# Patient Record
Sex: Male | Born: 1965
Health system: Southern US, Community
[De-identification: ages and names within clinical notes are randomized; demographics above are authoritative.]

## PROBLEM LIST (undated history)

## (undated) DIAGNOSIS — K200 Eosinophilic esophagitis: Secondary | ICD-10-CM

## (undated) HISTORY — PX: UPPER GASTROINTESTINAL ENDOSCOPY: SHX188

## (undated) HISTORY — DX: Eosinophilic esophagitis: K20.0

---

## 2019-01-01 ENCOUNTER — Ambulatory Visit (HOSPITAL_COMMUNITY)
Admission: EM | Admit: 2019-01-01 | Discharge: 2019-01-01 | Disposition: A | Discharge status: Home / Self Care | Payer: 59 | Attending: Internal Medicine | Admitting: Internal Medicine

## 2019-01-01 ENCOUNTER — Encounter (HOSPITAL_COMMUNITY): Payer: Self-pay | Admitting: Emergency Medicine

## 2019-01-01 ENCOUNTER — Encounter: Payer: Self-pay | Admitting: *Deleted

## 2019-01-01 ENCOUNTER — Other Ambulatory Visit: Payer: Self-pay

## 2019-01-01 ENCOUNTER — Encounter (HOSPITAL_COMMUNITY)
Admission: EM | Disposition: A | Discharge status: Home / Self Care | Payer: Self-pay | Source: Home / Self Care | Attending: Emergency Medicine

## 2019-01-01 DIAGNOSIS — X58XXXA Exposure to other specified factors, initial encounter: Secondary | ICD-10-CM | POA: Diagnosis not present

## 2019-01-01 DIAGNOSIS — K222 Esophageal obstruction: Secondary | ICD-10-CM | POA: Insufficient documentation

## 2019-01-01 DIAGNOSIS — K229 Disease of esophagus, unspecified: Secondary | ICD-10-CM | POA: Diagnosis not present

## 2019-01-01 DIAGNOSIS — R0789 Other chest pain: Secondary | ICD-10-CM | POA: Insufficient documentation

## 2019-01-01 DIAGNOSIS — K2990 Gastroduodenitis, unspecified, without bleeding: Secondary | ICD-10-CM

## 2019-01-01 DIAGNOSIS — Z79899 Other long term (current) drug therapy: Secondary | ICD-10-CM | POA: Diagnosis not present

## 2019-01-01 DIAGNOSIS — K319 Disease of stomach and duodenum, unspecified: Secondary | ICD-10-CM | POA: Diagnosis not present

## 2019-01-01 DIAGNOSIS — K209 Esophagitis, unspecified without bleeding: Secondary | ICD-10-CM | POA: Insufficient documentation

## 2019-01-01 DIAGNOSIS — T18128A Food in esophagus causing other injury, initial encounter: Secondary | ICD-10-CM

## 2019-01-01 DIAGNOSIS — K2980 Duodenitis without bleeding: Secondary | ICD-10-CM | POA: Diagnosis not present

## 2019-01-01 HISTORY — PX: BIOPSY: SHX5522

## 2019-01-01 HISTORY — PX: ESOPHAGOGASTRODUODENOSCOPY: SHX5428

## 2019-01-01 HISTORY — PX: FOREIGN BODY REMOVAL: SHX962

## 2019-01-01 SURGERY — EGD (ESOPHAGOGASTRODUODENOSCOPY)
Anesthesia: Moderate Sedation

## 2019-01-01 MED ORDER — PANTOPRAZOLE SODIUM 40 MG PO TBEC: 40.0000 mg | DELAYED_RELEASE_TABLET | Freq: Every day | ORAL | 0 refills | Status: DC

## 2019-01-01 MED ORDER — MIDAZOLAM HCL (PF) 10 MG/2ML IJ SOLN
INTRAMUSCULAR | Status: DC | PRN
  Administered 2019-01-01 (×3): 2 mg via INTRAVENOUS
  Administered 2019-01-01: 1 mg via INTRAVENOUS
  Administered 2019-01-01: 2 mg via INTRAVENOUS
  Administered 2019-01-01: 1 mg via INTRAVENOUS

## 2019-01-01 MED ORDER — FENTANYL CITRATE (PF) 100 MCG/2ML IJ SOLN
INTRAMUSCULAR | Status: DC | PRN
  Administered 2019-01-01 (×6): 25 ug via INTRAVENOUS

## 2019-01-01 MED ORDER — BUTAMBEN-TETRACAINE-BENZOCAINE 2-2-14 % EX AERO
INHALATION_SPRAY | CUTANEOUS | Status: DC | PRN
  Administered 2019-01-01: 03:00:00 2 via TOPICAL

## 2019-01-01 MED ORDER — SODIUM CHLORIDE 0.9 % IV SOLN
INTRAVENOUS | Status: AC | PRN
  Administered 2019-01-01: 500 mL via INTRAVENOUS

## 2019-01-01 MED ORDER — DIPHENHYDRAMINE HCL 50 MG/ML IJ SOLN
INTRAMUSCULAR | Status: DC | PRN
  Administered 2019-01-01 (×2): 25 mg via INTRAVENOUS

## 2019-01-01 MED ORDER — DIPHENHYDRAMINE HCL 50 MG/ML IJ SOLN
INTRAMUSCULAR | Status: AC
  Filled 2019-01-01: qty 1

## 2019-01-01 MED ORDER — MIDAZOLAM HCL (PF) 5 MG/ML IJ SOLN
INTRAMUSCULAR | Status: AC
  Filled 2019-01-01: qty 3

## 2019-01-01 MED ORDER — FENTANYL CITRATE (PF) 100 MCG/2ML IJ SOLN
INTRAMUSCULAR | Status: AC
  Filled 2019-01-01: qty 4

## 2019-01-01 NOTE — Discharge Instructions (Addendum)
Take pantoprazole and maintain a soft diet.   YOU HAD AN ENDOSCOPIC PROCEDURE TODAY: Refer to the procedure report that was given to you for any specific questions about what was found during the examination.  If the procedure report does not answer your questions, please call your gastroenterologist to clarify.  YOU SHOULD EXPECT: Some feelings of bloating in the abdomen. Passage of more gas than usual.  Walking can help get rid of the air that was put into your GI tract during the procedure and reduce the bloating. If you had a lower endoscopy (such as a colonoscopy or flexible sigmoidoscopy) you may notice spotting of blood in your stool or on the toilet paper.   DIET: Your first meal following the procedure should be a light meal and then it is ok to progress to your normal diet.  A half-sandwich or bowl of soup is an example of a good first meal.  Heavy or fried foods are harder to digest and may make you feel nasueas or bloated.  Drink plenty of fluids but you should avoid alcoholic beverages for 24 hours.  ACTIVITY: Your care partner should take you home directly after the procedure.  You should plan to take it easy, moving slowly for the rest of the day.  You can resume normal activity the day after the procedure however you should NOT DRIVE or use heavy machinery for 24 hours (because of the sedation medicines used during the test).    SYMPTOMS TO REPORT IMMEDIATELY  A gastroenterologist can be reached at any hour.  Please call your doctor's office for any of the following symptoms:  Following lower endoscopy (colonoscopy, flexible sigmoidoscopy)  Excessive amounts of blood in the stool  Significant tenderness, worsening of abdominal pains  Swelling of the abdomen that is new, acute  Fever of 100 or higher Following upper endoscopy (EGD, EUS, ERCP)  Vomiting of blood or coffee ground material  New, significant abdominal pain  New, significant chest pain or pain under the shoulder  blades  Painful or persistently difficult swallowing  New shortness of breath  Black, tarry-looking stools  FOLLOW UP: If any biopsies were taken you will be contacted by phone or by letter within the next 1-3 weeks.  Call your gastroenterologist if you have not heard about the biopsies in 3 weeks.  Please also call your gastroenterologist's office with any specific questions about appointments or follow up tests.

## 2019-01-01 NOTE — ED Notes (Signed)
Reviewed d/c instructions with pt, who verbalized understanding and had no outstanding questions. Pt departed in NAD.   

## 2019-01-01 NOTE — ED Triage Notes (Signed)
Pt arrives POV from [facility]. Reports he was eating ribs tonight when a food bolus became lodged in his throat. Denies SOB, N/V, but states he is unable to swallow his oral secretions and has to spit them out. Received three doses of IV glucagon while at [facility], without relief or improvement.

## 2019-01-01 NOTE — ED Provider Notes (Signed)
[facility] EMERGENCY DEPARTMENT Provider Note   CSN: [id number] Arrival date & time: 01/01/19  0104    History   Chief Complaint Chief Complaint  Patient presents with  . Food Bolus    HPI Raymond Mccarthy is a 53 y.o. male.     HPI  This is a 53 year old male with no significant past medical history who presents with possible food bolus.  He was transferred from [facility].  Patient reports that this afternoon he was eating beef ribs when he felt 1 get stuck.  This has happened before but he has been able to clear it himself.  Started approximately 4 PM.  Since that time he has had persistent foreign body sensation and difficulty swallowing his spit.  He was seen at outside hospital.  He had glucagon x2 which was not successful.  He was transferred here for GI consultation.  He denies any chest pain, shortness of breath, nausea, vomiting.  He otherwise states that he feels fine.  History reviewed. No pertinent past medical history.  Patient Active Problem List   Diagnosis Date Noted  . Esophageal obstruction due to food impaction   . Esophagitis   . Gastritis and duodenitis     History reviewed. No pertinent surgical history.      Home Medications    Prior to Admission medications   Medication Sig Start Date End Date Taking? Authorizing Provider  pantoprazole (PROTONIX) 40 MG tablet Take 1 tablet (40 mg total) by mouth daily. 01/01/19   Nikhil Osei, Barbette Hair, MD    Family History History reviewed. No pertinent family history.  Social History Social History   Tobacco Use  . Smoking status: Never Smoker  . Smokeless tobacco: Never Used  Substance Use Topics  . Alcohol use: Not Currently  . Drug use: Not Currently     Allergies   Patient has no known allergies.   Review of Systems Review of Systems  Constitutional: Negative for fever.  HENT: Positive for trouble swallowing.   Respiratory: Negative for shortness of breath.    Cardiovascular: Negative for chest pain.  Gastrointestinal: Negative for abdominal pain, diarrhea, nausea and vomiting.  All other systems reviewed and are negative.    Physical Exam Updated Vital Signs BP 117/77   Pulse 73   Temp 98.2 F (36.8 C) (Oral)   Resp 18   Ht 1.702 m (5\' 7" )   Wt 93.4 kg   SpO2 96%   BMI 32.26 kg/m   Physical Exam Vitals signs and nursing note reviewed.  Constitutional:      Appearance: He is well-developed. He is not ill-appearing.  HENT:     Head: Normocephalic and atraumatic.     Mouth/Throat:     Comments: Persistently spitting into an emesis basin Neck:     Musculoskeletal: Neck supple.  Cardiovascular:     Rate and Rhythm: Normal rate and regular rhythm.  Pulmonary:     Effort: Pulmonary effort is normal.     Breath sounds: Normal breath sounds.  Musculoskeletal:        General: No swelling.  Skin:    General: Skin is warm and dry.  Neurological:     Mental Status: He is alert and oriented to person, place, and time.  Psychiatric:        Mood and Affect: Mood normal.      ED Treatments / Results  Labs (all labs ordered are listed, but only abnormal results are displayed) Labs Reviewed  SURGICAL PATHOLOGY    EKG None  Radiology No results found.  Procedures Procedures (including critical care time)  Medications Ordered in ED Medications  0.9 %  sodium chloride infusion (  Stopped 01/01/19 0407)     Initial Impression / Assessment and Plan / ED Course  I have reviewed the triage vital signs and the nursing notes.  Pertinent labs & imaging results that were available during my care of the patient were reviewed by me and considered in my medical decision making (see chart for details).  Clinical Course as of Jan 01 512  Wed Jan 01, 2019  0459 Patient continues to be fairly sedated following endoscopy.  We will continue to monitor.   [CH]    Clinical Course User Index [CH] Gwenette Wellons, Barbette Hair, MD        Patient presents with likely food bolus.  He is overall nontoxic-appearing and airway is intact.  He is spitting into an emesis basin.  Will consult gastroenterology.  Gastroenterology performed endoscopy removing a food impaction.  Patient was monitored closely in the emergency department.  He will be discharged home on pantoprazole and a soft diet per Dr. Hilarie Fredrickson.  On final reevaluation, patient is awake and alert.  He has family to drive him home.  After history, exam, and medical workup I feel the patient has been appropriately medically screened and is safe for discharge home. Pertinent diagnoses were discussed with the patient. Patient was given return precautions.   Final Clinical Impressions(s) / ED Diagnoses   Final diagnoses:  Food impaction of esophagus, initial encounter    ED Discharge Orders         Ordered    pantoprazole (PROTONIX) 40 MG tablet  Daily     01/01/19 0512           Merryl Hacker, MD 01/01/19 [phone]

## 2019-01-01 NOTE — Op Note (Signed)
[facility] Patient Name: Raymond Mccarthy Procedure Date : 01/01/2019 MRN: [id number] Attending MD: Jerene Bears , MD Date of Birth: 03/14/1966 CSN: [id number] Age: 53 Admit Type: Emergency Department Procedure:                Upper GI endoscopy Indications:              Dysphagia, Foreign body in the esophagus Providers:                Lajuan Lines. Hilarie Fredrickson, MD, Carlyn Reichert, RN, Glori Bickers,                            RN, Laverda Sorenson, Technician Referring MD:             Thayer Jew, MD Raymond Mccarthy) Medicines:                Fentanyl 150 micrograms IV, Midazolam 10 mg IV,                            Diphenhydramine 50 mg IV Complications:            No immediate complications. Estimated Blood Loss:      Procedure:                Pre-Anesthesia Assessment:                           - Prior to the procedure, a History and Physical                            was performed, and patient medications and                            allergies were reviewed. The patient's tolerance of                            previous anesthesia was also reviewed. The risks                            and benefits of the procedure and the sedation                            options and risks were discussed with the patient.                            All questions were answered, and informed consent                            was obtained. Prior Anticoagulants: The patient has                            taken no previous anticoagulant or antiplatelet                            agents. ASA Grade Assessment: II - A patient with  mild systemic disease. After reviewing the risks                            and benefits, the patient was deemed in                            satisfactory condition to undergo the procedure.                           After obtaining informed consent, the endoscope was                            passed under direct vision. Throughout the                     procedure, the patient's blood pressure, pulse, and                            oxygen saturations were monitored continuously. The                            GIF-H190 ([id number]) Olympus gastroscope was                            introduced through the mouth, and advanced to the                            second part of duodenum. The upper GI endoscopy was                            accomplished without difficulty. The patient                            tolerated the procedure well. Scope In: Scope Out: Findings:      Food was found in the lower third of the esophagus. Removal of food was       accomplished. A banding cap was used to dislodge the impaction from the       lower esophagus. Once the bolus was moved into the more proximal       esophagus a net was deployed and the bolus removed from the mouth.      Moderately severe esophagitis with no bleeding was found in the entire       esophagus. Biopsies were obtained from the proximal and distal esophagus       with cold forceps for histology of suspected eosinophilic esophagitis.      Mild inflammation characterized by erythema was found in the gastric       body and in the gastric antrum. Biopsies were taken with a cold forceps       for histology and Helicobacter pylori testing.      Diffuse moderate inflammation characterized by erythema was found in the       duodenal bulb.      The second portion of the duodenum was normal. Impression:               - Food in the lower third of the esophagus. Removal  was successful.                           - Moderately severe acute and chronic esophagitis.                            Biopsied.                           - Gastritis. Biopsied.                           - Duodenitis.                           - Normal second portion of the duodenum. Moderate Sedation:      Moderate (conscious) sedation was administered by the endoscopy nurse       and  supervised by the endoscopist. The following parameters were       monitored: oxygen saturation, heart rate, blood pressure, and response       to care. Total physician intraservice time was 33 minutes. Recommendation:           - Patient has a contact number available for                            emergencies. The signs and symptoms of potential                            delayed complications were discussed with the                            patient. Return to normal activities tomorrow.                            Written discharge instructions were provided to the                            patient.                           - Resume soft diet. Chew food very well and avoid                            meats and breads.                           - Continue present medications.                           - Avoid NSAIDs.                           - Begin pantoprazole 40 mg once daily for at least                            8 weeks.                           -  Await pathology results.                           - Anticipate the need for repeat EGD for dilation.                            Screening colonoscopy recommended if not done                            previously.                           - Virtual visit in 2-4 weeks. Procedure Code(s):        --- Professional ---                           [phone], Esophagogastroduodenoscopy, flexible,                            transoral; with removal of foreign body(s)                           43239, Esophagogastroduodenoscopy, flexible,                            transoral; with biopsy, single or multiple                           99152, 59, Moderate sedation services provided by                            the same physician or other qualified health care                            professional performing the diagnostic or                            therapeutic service that the sedation supports,                            requiring the presence  of an independent trained                            observer to assist in the monitoring of the                            patient's level of consciousness and physiological                            status; initial 15 minutes of intraservice time,                            patient age 64 years or older                           99153, Moderate sedation; each  additional 15                            minutes intraservice time Diagnosis Code(s):        --- Professional ---                           [phone], Food in esophagus causing other injury,                            initial encounter                           K20.9, Esophagitis, unspecified                           K29.70, Gastritis, unspecified, without bleeding                           K29.80, Duodenitis without bleeding                           R13.10, Dysphagia, unspecified                           T18.108A, Unspecified foreign body in esophagus                            causing other injury, initial encounter CPT copyright 2019 American Medical Association. All rights reserved. The codes documented in this report are preliminary and upon coder review may  be revised to meet current compliance requirements. Jerene Bears, MD 01/01/2019 3:50:33 AM This report has been signed electronically. Number of Addenda: 0

## 2019-01-01 NOTE — Consult Note (Signed)
   Referring Provider: Dr. Raynelle Jan ER Primary Care Physician:  Glenda Chroman, MD Primary Gastroenterologist: none  Reason for Consultation:  Food impaction  HPI: Raymond Mccarthy is a 53 y.o. male with no significant PMH here from [facility] with esophageal food impaction.  Hx of intermittent solid food dysphagia but never this long nor needing to seek medical attention.  Usually able to get the food back up by himself.  Denies GERD symptoms.  No abd pain.  No melena, rectal bleeding.  Chest pressure since impaction started, but no pain or dyspnea.   Started around 5 pm after eating ribs.  Does not take medication. Denies FH of gi malignancy.   History reviewed. No pertinent past medical history.  History reviewed. No pertinent surgical history.  Prior to Admission medications   Not on File    No current facility-administered medications for this encounter.    No current outpatient medications on file.    Allergies as of 01/01/2019  . (No Known Allergies)    History reviewed. No pertinent family history.  SH: denies tobacco, illicit drug use and etoh  ROS: as per HPI, otherwise negative  Physical Exam: Vital signs in last 24 hours: Temp:  [98.2 F (36.8 C)] 98.2 F (36.8 C) (04/22 0116) Pulse Rate:  [72-87] 72 (04/22 0215) Resp:  [16] 16 (04/22 0116) BP: (140-173)/(81-97) 144/81 (04/22 0215) SpO2:  [95 %-98 %] 95 % (04/22 0215) Weight:  [93.4 kg] 93.4 kg (04/22 0117)   Gen: awake, alert, NAD HEENT: anicteric, op clear CV: RRR, no mrg Pulm: CTA b/l Abd: soft, NT/ND, +BS throughout Ext: no c/c/e Neuro: nonfocal  Studies/Results: No results found.  IMPRESSION:  53 yo with acute esophageal food impaction  PLAN: Emergent bedside EGD in ER with moderate sedation. Will need EGD as outpatient in the coming weeks for dilation.  The nature of the procedure, as well as the risks, benefits, and alternatives were carefully and thoroughly reviewed  with the patient. Ample time for discussion and questions allowed. The patient understood, was satisfied, and agreed to proceed.    Lajuan Lines Amellia Panik  01/01/2019, 3:04 AM

## 2019-01-02 ENCOUNTER — Encounter (HOSPITAL_COMMUNITY): Payer: Self-pay | Admitting: Internal Medicine

## 2019-01-23 ENCOUNTER — Encounter: Payer: Self-pay | Admitting: *Deleted

## 2019-01-27 ENCOUNTER — Encounter: Payer: Self-pay | Admitting: Internal Medicine

## 2019-01-27 ENCOUNTER — Ambulatory Visit (INDEPENDENT_AMBULATORY_CARE_PROVIDER_SITE_OTHER): Payer: 59 | Admitting: Internal Medicine

## 2019-01-27 ENCOUNTER — Other Ambulatory Visit: Payer: Self-pay

## 2019-01-27 VITALS — Ht 68.0 in | Wt 200.0 lb

## 2019-01-27 DIAGNOSIS — R1319 Other dysphagia: Secondary | ICD-10-CM

## 2019-01-27 DIAGNOSIS — Z1211 Encounter for screening for malignant neoplasm of colon: Secondary | ICD-10-CM

## 2019-01-27 DIAGNOSIS — K2100 Gastro-esophageal reflux disease with esophagitis, without bleeding: Secondary | ICD-10-CM

## 2019-01-27 DIAGNOSIS — R1310 Dysphagia, unspecified: Secondary | ICD-10-CM | POA: Diagnosis not present

## 2019-01-27 MED ORDER — PANTOPRAZOLE SODIUM 40 MG PO TBEC: 40.0000 mg | DELAYED_RELEASE_TABLET | Freq: Every day | ORAL | 1 refills | Status: DC

## 2019-01-27 NOTE — Addendum Note (Signed)
Addended by: Larina Bras on: 01/27/2019 02:21 PM   Modules accepted: Orders

## 2019-01-27 NOTE — Progress Notes (Signed)
Patient ID: Raymond Mccarthy, male   DOB: 1965-10-24, 53 y.o.   MRN: [id number]  This service was provided via telemedicine.  Doximity app with A/V communication [place] The provider was located in provider's GI office. The patient did consent to this telephone visit and is aware of possible charges through their insurance for this visit.   The persons participating in this telemedicine service were the patient and I. Time spent on call: 11 min  HPI: Raymond Mccarthy is a 53 year old male with a past medical history of GERD with esophageal food impaction who is seen in hospital follow-up.  He is seen virtually today in the setting of COVID-19 pandemic.  I met him on 01/01/2019 in the ER where he presented with an acute esophageal food impaction.  I performed an emergent bedside upper endoscopy with moderate sedation to relieve the food impaction.  He had evidence of esophagitis with linear furrowing and also reflux esophagitis in the lower esophagus at GE junction.  Biopsies showed 15 eosinophils per high-powered field and it was difficult to know whether this was acid reflux related or eosinophilic esophagitis.  He had gastroduodenitis but biopsies were negative for H. Pylori.   Since this endoscopy he has been on pantoprazole 40 mg a day.  He has been eating a softer diet trying to chew his food well.  Since the endoscopy he has had 2 occasions where foods seem to get caught in his esophagus.  On one occasion after eating [place] fries he was able to get the food to pass after a few minutes.  On the other occasion after eating hamburger he had to regurgitate the food back up.  This always seems to happen in the very early part of the meal.  No complaint of heartburn.  Normal bowel habits.  No blood in his stool or melena.  No family history of colon cancer.  He has never had a colonoscopy.    Past Medical History:  Diagnosis Date  . Eosinophilic esophagitis      Past Surgical History:  Procedure Laterality Date  . BIOPSY  01/01/2019   Procedure: BIOPSY;  Surgeon: Jerene Bears, MD;  Location: [facility] ENDOSCOPY;  Service: Gastroenterology;;  . ESOPHAGOGASTRODUODENOSCOPY N/A 01/01/2019   Procedure: ESOPHAGOGASTRODUODENOSCOPY (EGD);  Surgeon: Jerene Bears, MD;  Location: [facility] ENDOSCOPY;  Service: Gastroenterology;  Laterality: N/A;  . FOREIGN BODY REMOVAL  01/01/2019   Procedure: FOREIGN BODY REMOVAL;  Surgeon: Jerene Bears, MD;  Location: [facility] ENDOSCOPY;  Service: Gastroenterology;;    Outpatient Medications Prior to Visit  Medication Sig Dispense Refill  . pantoprazole (PROTONIX) 40 MG tablet Take 1 tablet (40 mg total) by mouth daily. 30 tablet 0   No facility-administered medications prior to visit.     No Known Allergies  Family History  Problem Relation Age of Onset  . Colon cancer Neg Hx   . Esophageal cancer Neg Hx   . Stomach cancer Neg Hx   . Pancreatic cancer Neg Hx   . Liver disease Neg Hx     Social History   Tobacco Use  . Smoking status: Never Smoker  . Smokeless tobacco: Never Used  Substance Use Topics  . Alcohol use: Not Currently  . Drug use: Not Currently    ROS: As per history of present illness, otherwise negative  Ht _0  (1.727 m)   Wt 200 lb (90.7 kg)   BMI 30.41 kg/m  No physical exam, virtual  visit   ASSESSMENT/PLAN: 53 year old male with a past medical history of GERD with esophageal food impaction who is seen in hospital follow-up  1.  Esophageal dysphagia/GERD with esophagitis versus EoE --difficult to know if his esophageal eosinophilia is related to eosinophilic esophagitis or reflux.  Either way he is still having intermittent dysphagia likely due to distal esophageal stricture.  I recommended that he continue PPI daily and return for upper endoscopy for dilation.  At that time we can perform esophageal dilation but also repeat biopsies to see if his eosinophilia has improved with PPI therapy.  If  not he will need topical glucocorticoid therapy.  We discussed the risk, benefits and alternatives to upper endoscopy and he is agreeable and wishes to proceed. --Continue pantoprazole 40 mg daily, 30 minutes before breakfast --he will need refills. --Arrange upper endoscopy with dilation and esophageal biopsy --Continue soft diet, chewing well and taking smaller bites.  I also encouraged him to drink 6 ounces of liquid before he starts to eat solid food at each meal.  2.  Colon cancer screening --I recommended average risk colonoscopy.  This can be performed on the same day as the above discussed endoscopy.  We discussed the risk, benefits and alternatives and he is agreeable and wishes to proceed --Please arrange colonoscopy for screening     NH:AFBX, Rennis Petty [address], [address], [place] [id number]

## 2019-01-27 NOTE — Patient Instructions (Addendum)
We have sent the following medications to your pharmacy for you to pick up at your convenience: pantoprazole 40 mg daily, 30 minutes before breakfast   You have been scheduled for a previsit telephone call on 01/31/19 at 4:30 pm.  You have been scheduled for an endoscopy and colonoscopy on 02/07/19 at 2:00 pm with a 1:00 pm arrival.  Continue a soft diet, chewing well and taking smaller bites.   Drink 6 ounces of liquid before you start to eat solid food at each meal.

## 2019-01-31 ENCOUNTER — Other Ambulatory Visit: Payer: Self-pay

## 2019-01-31 ENCOUNTER — Ambulatory Visit (AMBULATORY_SURGERY_CENTER): Payer: 59 | Admitting: *Deleted

## 2019-01-31 VITALS — Ht 67.0 in | Wt 204.0 lb

## 2019-01-31 DIAGNOSIS — Z1211 Encounter for screening for malignant neoplasm of colon: Secondary | ICD-10-CM

## 2019-01-31 DIAGNOSIS — R1310 Dysphagia, unspecified: Secondary | ICD-10-CM

## 2019-01-31 MED ORDER — NA SULFATE-K SULFATE-MG SULF 17.5-3.13-1.6 GM/177ML PO SOLN: 1.0000 | Freq: Once | ORAL | 0 refills | Status: AC

## 2019-01-31 NOTE — Progress Notes (Signed)
No egg or soy allergy known to patient  No issues with past sedation with any surgeries  or procedures, no intubation problems  No diet pills per patient No home 02 use per patient  No blood thinners per patient  Pt denies issues with constipation  No A fib or A flutter  EMMI video sent to pt's e mail    Pt's states no medical or surgical  Or family history changes since previsit or office visit on 01-27-2019 with Dr Hilarie Fredrickson   Pt verified name, DOB, address and insurance during PV today. Pt mailed instruction packet to included paper to complete and mail back to [facility] with addressed and stamped envelope, Emmi video, copy of consent form to read and not return, and instructions. Suprep $15  coupon mailed in packet. PV completed over the phone. Pt encouraged to call with questions or issues

## 2019-02-07 ENCOUNTER — Encounter: Payer: 59 | Admitting: Internal Medicine

## 2019-02-10 ENCOUNTER — Telehealth: Payer: Self-pay

## 2019-02-10 NOTE — Telephone Encounter (Signed)
Left message

## 2019-02-11 ENCOUNTER — Telehealth: Payer: Self-pay | Admitting: *Deleted

## 2019-02-11 NOTE — Telephone Encounter (Signed)
Covid-19 screening questions  Have you traveled in the last 14 days?no If yes where?  Do you now or have you had a fever in the last 14 days?no  Do you have any respiratory symptoms of shortness of breath or cough now or in the last 14 days?no  Do you have any family members or close contacts with diagnosed or suspected Covid-19 in the past 14 days?no  Have you been tested for Covid-19 and found to be positive?no  Pt was made aware of care partner policy and will bring a mask with him if he has one. SM

## 2019-02-12 ENCOUNTER — Ambulatory Visit (AMBULATORY_SURGERY_CENTER): Payer: 59 | Admitting: Internal Medicine

## 2019-02-12 ENCOUNTER — Encounter: Payer: Self-pay | Admitting: Internal Medicine

## 2019-02-12 ENCOUNTER — Other Ambulatory Visit: Payer: Self-pay

## 2019-02-12 VITALS — BP 110/66 | HR 70 | Temp 98.3°F | Resp 15 | Ht 68.0 in | Wt 200.0 lb

## 2019-02-12 DIAGNOSIS — K200 Eosinophilic esophagitis: Secondary | ICD-10-CM | POA: Diagnosis not present

## 2019-02-12 DIAGNOSIS — K2980 Duodenitis without bleeding: Secondary | ICD-10-CM

## 2019-02-12 DIAGNOSIS — K2970 Gastritis, unspecified, without bleeding: Secondary | ICD-10-CM

## 2019-02-12 DIAGNOSIS — R1310 Dysphagia, unspecified: Secondary | ICD-10-CM

## 2019-02-12 DIAGNOSIS — D123 Benign neoplasm of transverse colon: Secondary | ICD-10-CM | POA: Diagnosis not present

## 2019-02-12 DIAGNOSIS — Z1211 Encounter for screening for malignant neoplasm of colon: Secondary | ICD-10-CM

## 2019-02-12 DIAGNOSIS — R1319 Other dysphagia: Secondary | ICD-10-CM

## 2019-02-12 MED ORDER — FLUTICASONE PROPIONATE 50 MCG/ACT NA SUSP: 2.0000 | Freq: Two times a day (BID) | NASAL | Status: DC

## 2019-02-12 MED ORDER — SODIUM CHLORIDE 0.9 % IV SOLN: 500.0000 mL | Freq: Once | INTRAVENOUS | Status: DC

## 2019-02-12 NOTE — Patient Instructions (Signed)
Handouts given for diverticulosis and polyps.  YOU HAD AN ENDOSCOPIC PROCEDURE TODAY AT [facility]:   Refer to the procedure report that was given to you for any specific questions about what was found during the examination.  If the procedure report does not answer your questions, please call your gastroenterologist to clarify.  If you requested that your care partner not be given the details of your procedure findings, then the procedure report has been included in a sealed envelope for you to review at your convenience later.  YOU SHOULD EXPECT: Some feelings of bloating in the abdomen. Passage of more gas than usual.  Walking can help get rid of the air that was put into your GI tract during the procedure and reduce the bloating. If you had a lower endoscopy (such as a colonoscopy or flexible sigmoidoscopy) you may notice spotting of blood in your stool or on the toilet paper. If you underwent a bowel prep for your procedure, you may not have a normal bowel movement for a few days.  Please Note:  You might notice some irritation and congestion in your nose or some drainage.  This is from the oxygen used during your procedure.  There is no need for concern and it should clear up in a day or so.  SYMPTOMS TO REPORT IMMEDIATELY:   Following lower endoscopy (colonoscopy or flexible sigmoidoscopy):  Excessive amounts of blood in the stool  Significant tenderness or worsening of abdominal pains  Swelling of the abdomen that is new, acute  Fever of 100F or higher   Following upper endoscopy (EGD)  Vomiting of blood or coffee ground material  New chest pain or pain under the shoulder blades  Painful or persistently difficult swallowing  New shortness of breath  Fever of 100F or higher  Black, tarry-looking stools  For urgent or emergent issues, a gastroenterologist can be reached at any hour by calling [phone].   DIET:  SEE POST DILATION DIET INSTRUCTIONS, THEN  TOMORROW  you may proceed to your regular diet.  Drink plenty of fluids but you should avoid alcoholic beverages for 24 hours.  ACTIVITY:  You should plan to take it easy for the rest of today and you should NOT DRIVE or use heavy machinery until tomorrow (because of the sedation medicines used during the test).    FOLLOW UP: Our staff will call the number listed on your records 48-72 hours following your procedure to check on you and address any questions or concerns that you may have regarding the information given to you following your procedure. If we do not reach you, we will leave a message.  We will attempt to reach you two times.  During this call, we will ask if you have developed any symptoms of COVID 19. If you develop any symptoms (ie: fever, flu-like symptoms, shortness of breath, cough etc.) before then, please call [phone].  If you test positive for Covid 19 in the 2 weeks post procedure, please call and report this information to [place].    If any biopsies were taken you will be contacted by phone or by letter within the next 1-3 weeks.  Please call us at [phone] if you have not heard about the biopsies in 3 weeks.    SIGNATURES/CONFIDENTIALITY: You and/or your care partner have signed paperwork which will be entered into your electronic medical record.  These signatures attest to the fact that that the information above on your After Visit Summary  has been reviewed and is understood.  Full responsibility of the confidentiality of this discharge information lies with you and/or your care-partner.

## 2019-02-12 NOTE — Progress Notes (Signed)
Called to room to assist during endoscopic procedure.  Patient ID and intended procedure confirmed with present staff. Received instructions for my participation in the procedure from the performing physician.  

## 2019-02-12 NOTE — Progress Notes (Signed)
PT taken to PACU. Monitors in place. VSS. Report given to RN. 

## 2019-02-12 NOTE — Op Note (Signed)
[facility] Patient Name: Raymond Mccarthy Procedure Date: 02/12/2019 1:45 PM MRN: [id number] Endoscopist: Jerene Bears , MD Age: 53 Referring MD:  Date of Birth: [birth year] Gender: Male Account #: [id number] Procedure:                Colonoscopy Indications:              Screening for colorectal malignant neoplasm, This                            is the patient's first colonoscopy Medicines:                Monitored Anesthesia Care Procedure:                Pre-Anesthesia Assessment:                           - Prior to the procedure, a History and Physical                            was performed, and patient medications and                            allergies were reviewed. The patient's tolerance of                            previous anesthesia was also reviewed. The risks                            and benefits of the procedure and the sedation                            options and risks were discussed with the patient.                            All questions were answered, and informed consent                            was obtained. Prior Anticoagulants: The patient has                            taken no previous anticoagulant or antiplatelet                            agents. ASA Grade Assessment: II - A patient with                            mild systemic disease. After reviewing the risks                            and benefits, the patient was deemed in                            satisfactory condition to undergo the procedure.  After obtaining informed consent, the colonoscope                            was passed under direct vision. Throughout the                            procedure, the patient's blood pressure, pulse, and                            oxygen saturations were monitored continuously. The                            Colonoscope was introduced through the anus and                            advanced to the cecum,  identified by appendiceal                            orifice and ileocecal valve. The colonoscopy was                            performed without difficulty. The patient tolerated                            the procedure well. The quality of the bowel                            preparation was good. The ileocecal valve,                            appendiceal orifice, and rectum were photographed. Scope In: 2:01:54 PM Scope Out: 2:17:25 PM Scope Withdrawal Time: 0 hours 11 minutes 8 seconds  Total Procedure Duration: 0 hours 15 minutes 31 seconds  Findings:                 The digital rectal exam was normal.                           A 5 mm polyp was found in the transverse colon. The                            polyp was sessile. The polyp was removed with a                            cold snare. Resection and retrieval were complete.                           A few small-mouthed diverticula were found in the                            sigmoid colon, descending colon and ascending colon.                           The exam was otherwise  without abnormality on                            direct and retroflexion views. Complications:            No immediate complications. Estimated Blood Loss:     Estimated blood loss was minimal. Impression:               - One 5 mm polyp in the transverse colon, removed                            with a cold snare. Resected and retrieved.                           - Diverticulosis in the sigmoid colon, in the                            descending colon and in the ascending colon.                           - The examination was otherwise normal on direct                            and retroflexion views. Recommendation:           - Patient has a contact number available for                            emergencies. The signs and symptoms of potential                            delayed complications were discussed with the                            patient.  Return to normal activities tomorrow.                            Written discharge instructions were provided to the                            patient.                           - Resume previous diet.                           - Continue present medications.                           - Await pathology results.                           - Repeat colonoscopy is recommended for                            surveillance. The colonoscopy date will be  determined after pathology results from today's                            exam become available for review. Jerene Bears, MD 02/12/2019 2:43:51 PM This report has been signed electronically.

## 2019-02-12 NOTE — Op Note (Signed)
[facility] Patient Name: Raymond Mccarthy Procedure Date: 02/12/2019 1:45 PM MRN: [id number] Endoscopist: Jerene Bears , MD Age: 53 Referring MD:  Date of Birth: 03/03/1966 Gender: Male Account #: [id number] Procedure:                Upper GI endoscopy Indications:              Dysphagia, Eosinophilic esophagitis, Follow-up of                            eosinophilic esophagitis Medicines:                Monitored Anesthesia Care Procedure:                Pre-Anesthesia Assessment:                           - Prior to the procedure, a History and Physical                            was performed, and patient medications and                            allergies were reviewed. The patient's tolerance of                            previous anesthesia was also reviewed. The risks                            and benefits of the procedure and the sedation                            options and risks were discussed with the patient.                            All questions were answered, and informed consent                            was obtained. Prior Anticoagulants: The patient has                            taken no previous anticoagulant or antiplatelet                            agents. ASA Grade Assessment: II - A patient with                            mild systemic disease. After reviewing the risks                            and benefits, the patient was deemed in                            satisfactory condition to undergo the procedure.  After obtaining informed consent, the endoscope was                            passed under direct vision. Throughout the                            procedure, the patient's blood pressure, pulse, and                            oxygen saturations were monitored continuously. The                            Endoscope was introduced through the mouth, and                            advanced to the second part of  duodenum. The upper                            GI endoscopy was accomplished without difficulty.                            The patient tolerated the procedure well. Scope In: Scope Out: Findings:                 Mucosal changes including longitudinal furrows and                            white plaques were found in the middle third of the                            esophagus, in the lower third of the esophagus and                            at the gastroesophageal junction. Esophageal                            findings were graded using the Eosinophilic                            Esophagitis Endoscopic Reference Score (EoE-EREFS)                            as: Edema Grade 1 Present (decreased clarity or                            absence of vascular markings), Rings Grade 1 Mild                            (subtle circumferential ridges seen on esophageal                            distension), Exudates Grade 1 Mild (scattered white  lesions involving less than 10 percent of the                            esophageal surface area), Furrows Grade 1 Present                            (vertical lines with or without visible depth) and                            Stricture present. Biopsies were obtained from the                            proximal and distal esophagus with cold forceps for                            histology of suspected eosinophilic esophagitis. A                            TTS dilator was passed through the scope. Dilation                            with a 16-17-18 mm balloon dilator was performed to                            16 mm. The dilation site was examined and showed                            moderate mucosal disruption and moderate                            improvement in luminal narrowing.                           The entire examined stomach was normal. Biopsies                            were taken with a cold forceps for histology  and                            Helicobacter pylori testing (given duodenitis).                           Localized moderate inflammation characterized by                            erythema was found in the duodenal bulb.                           The second portion of the duodenum was normal. Complications:            No immediate complications. Estimated Blood Loss:     Estimated blood loss was minimal. Impression:               - Esophageal mucosal changes consistent with  eosinophilic esophagitis. Biopsied. Dilated.                           - Normal stomach. Biopsied.                           - Duodenitis.                           - Normal second portion of the duodenum. Recommendation:           - Patient has a contact number available for                            emergencies. The signs and symptoms of potential                            delayed complications were discussed with the                            patient. Return to normal activities tomorrow.                            Written discharge instructions were provided to the                            patient.                           - Resume previous diet as tolerated, after                            post-dilation diet protocol.                           - Continue present medications.                           - Begin swallowed fluticasone therapy 2 sprays                            twice daily (swallowed, not inhaled) x 6 weeks for                            treatment of eosinophilic esophagitis.                           - Await pathology results.                           - Repeat upper endoscopy PRN for retreatment.                           - Office follow-up in about 3 months to assess EoE                            symptoms and response to dilation.  Jerene Bears, MD 02/12/2019 2:32:24 PM This report has been signed electronically.

## 2019-02-12 NOTE — Progress Notes (Signed)
Pt's states no medical or surgical changes since previsit or office visit.  [facility] CMA temps and Bethena Roys [place] CMA vitals. Sm

## 2019-02-14 ENCOUNTER — Other Ambulatory Visit: Payer: Self-pay | Admitting: Internal Medicine

## 2019-02-14 ENCOUNTER — Telehealth: Payer: Self-pay

## 2019-02-14 MED ORDER — FLUTICASONE PROPIONATE 50 MCG/ACT NA SUSP: NASAL | 1 refills | Status: DC

## 2019-02-14 NOTE — Telephone Encounter (Signed)
Pt requested fluticasone prescription to be sent to [facility] in [place].

## 2019-02-14 NOTE — Telephone Encounter (Signed)
Resent Fluticasone

## 2019-02-14 NOTE — Telephone Encounter (Signed)
  Follow up Call-  Call back number 02/12/2019  Post procedure Call Back phone  # [phone]  Permission to leave phone message Yes     Patient questions:  Do you have a fever, pain , or abdominal swelling? No. Pain Score  0 *  Have you tolerated food without any problems? Yes.    Have you been able to return to your normal activities? Yes.    Do you have any questions about your discharge instructions: Diet   No. Medications  No. Follow up visit  No.  Do you have questions or concerns about your Care? No.  Actions: * If pain score is 4 or above: No action needed, pain <4.  1. Have you developed a fever since your procedure? no  2.   Have you had an respiratory symptoms (SOB or cough) since your procedure? no  3.   Have you tested positive for COVID 19 since your procedure no  4.   Have you had any family members/close contacts diagnosed with the COVID 19 since your procedure?  no   If yes to any of these questions please route to Joylene John, RN and Alphonsa Gin, Therapist, sports.

## 2019-02-21 ENCOUNTER — Encounter: Payer: Self-pay | Admitting: Internal Medicine

## 2019-03-10 ENCOUNTER — Other Ambulatory Visit: Payer: Self-pay

## 2019-03-10 DIAGNOSIS — Z20822 Contact with and (suspected) exposure to covid-19: Secondary | ICD-10-CM

## 2019-03-14 ENCOUNTER — Ambulatory Visit: Payer: Self-pay | Admitting: *Deleted

## 2019-03-14 NOTE — Telephone Encounter (Signed)
I was tested Monday for COVID-19 and was wondering if my test was back.   I checked and it is not resulted.  I saw where he was active on Mychart.   I let him know that his results would come into his Mychart account when they were ready sooner than waiting to be called.  He was agreeable to using Mychart to get his results once they were resulted.

## 2019-03-15 LAB — NOVEL CORONAVIRUS, NAA: SARS-CoV-2, NAA: NOT DETECTED

## 2019-06-09 ENCOUNTER — Other Ambulatory Visit: Payer: Self-pay

## 2019-06-09 DIAGNOSIS — Z20822 Contact with and (suspected) exposure to covid-19: Secondary | ICD-10-CM

## 2019-06-10 LAB — NOVEL CORONAVIRUS, NAA: SARS-CoV-2, NAA: NOT DETECTED

## 2019-08-19 ENCOUNTER — Other Ambulatory Visit: Payer: Self-pay

## 2019-08-19 DIAGNOSIS — Z20822 Contact with and (suspected) exposure to covid-19: Secondary | ICD-10-CM

## 2019-08-21 LAB — NOVEL CORONAVIRUS, NAA: SARS-CoV-2, NAA: NOT DETECTED

## 2019-11-28 ENCOUNTER — Ambulatory Visit: Payer: 59 | Attending: Internal Medicine

## 2019-11-28 DIAGNOSIS — Z23 Encounter for immunization: Secondary | ICD-10-CM

## 2019-11-28 NOTE — Progress Notes (Signed)
   Covid-19 Vaccination Clinic  Name:  Redell Fryar    MRN: [id number] DOB: 1965/11/08  11/28/2019  Mr. Vaughns was observed post Covid-19 immunization for 15 minutes without incident. He was provided with Vaccine Information Sheet and instruction to access the V-Safe system.   Mr. Plazola was instructed to call 911 with any severe reactions post vaccine: Marland Kitchen Difficulty breathing  . Swelling of face and throat  . A fast heartbeat  . A bad rash all over body  . Dizziness and weakness   Immunizations Administered    Name Date Dose VIS Date Route   Moderna COVID-19 Vaccine 11/28/2019  9:08 AM 0.5 mL 08/12/2019 Intramuscular   Manufacturer: Moderna   Lot: [id number]   [place]:[id number]

## 2019-12-31 ENCOUNTER — Ambulatory Visit: Payer: 59 | Attending: Internal Medicine

## 2019-12-31 DIAGNOSIS — Z23 Encounter for immunization: Secondary | ICD-10-CM

## 2019-12-31 NOTE — Progress Notes (Signed)
   Covid-19 Vaccination Clinic  Name:  Swarit Frueh    MRN: [id number] DOB: [birth year]  12/31/2019  Mr. Laton was observed post Covid-19 immunization for 15 minutes without incident. He was provided with Vaccine Information Sheet and instruction to access the V-Safe system.   Mr. Nachreiner was instructed to call 911 with any severe reactions post vaccine: Marland Kitchen Difficulty breathing  . Swelling of face and throat  . A fast heartbeat  . A bad rash all over body  . Dizziness and weakness   Immunizations Administered    Name Date Dose VIS Date Route   Moderna COVID-19 Vaccine 12/31/2019  8:45 AM 0.5 mL 08/2019 Intramuscular   Manufacturer: Levan Hurst   Lot: [id number]   [place]: [id number]      Covid-19 Vaccination Clinic  Name:  Loral Demarchi    MRN: [id number] DOB: [birth year]  12/31/2019  Mr. Kristiansen was observed post Covid-19 immunization for 15 minutes without incident. He was provided with Vaccine Information Sheet and instruction to access the V-Safe system.   Mr. Whybrew was instructed to call 911 with any severe reactions post vaccine: Marland Kitchen Difficulty breathing  . Swelling of face and throat  . A fast heartbeat  . A bad rash all over body  . Dizziness and weakness   Immunizations Administered    Name Date Dose VIS Date Route   Moderna COVID-19 Vaccine 12/31/2019  8:45 AM 0.5 mL 08/2019 Intramuscular   Manufacturer: Moderna   Lot: [id number]   [place]:[id number]

## 2020-07-30 ENCOUNTER — Emergency Department (HOSPITAL_COMMUNITY): Payer: 59 | Admitting: Certified Registered Nurse Anesthetist

## 2020-07-30 ENCOUNTER — Ambulatory Visit (HOSPITAL_COMMUNITY)
Admission: EM | Admit: 2020-07-30 | Discharge: 2020-07-30 | Disposition: A | Discharge status: Home / Self Care | Payer: 59 | Attending: Emergency Medicine | Admitting: Emergency Medicine

## 2020-07-30 ENCOUNTER — Encounter (HOSPITAL_COMMUNITY): Payer: Self-pay

## 2020-07-30 ENCOUNTER — Encounter (HOSPITAL_COMMUNITY)
Admission: EM | Disposition: A | Discharge status: Home / Self Care | Payer: Self-pay | Source: Home / Self Care | Attending: Emergency Medicine

## 2020-07-30 ENCOUNTER — Other Ambulatory Visit: Payer: Self-pay

## 2020-07-30 ENCOUNTER — Telehealth: Payer: Self-pay | Admitting: Internal Medicine

## 2020-07-30 DIAGNOSIS — Z20822 Contact with and (suspected) exposure to covid-19: Secondary | ICD-10-CM | POA: Insufficient documentation

## 2020-07-30 DIAGNOSIS — Z79899 Other long term (current) drug therapy: Secondary | ICD-10-CM | POA: Insufficient documentation

## 2020-07-30 DIAGNOSIS — K2980 Duodenitis without bleeding: Secondary | ICD-10-CM | POA: Diagnosis not present

## 2020-07-30 DIAGNOSIS — T18128A Food in esophagus causing other injury, initial encounter: Secondary | ICD-10-CM | POA: Insufficient documentation

## 2020-07-30 DIAGNOSIS — X58XXXA Exposure to other specified factors, initial encounter: Secondary | ICD-10-CM | POA: Diagnosis not present

## 2020-07-30 DIAGNOSIS — K222 Esophageal obstruction: Secondary | ICD-10-CM | POA: Diagnosis not present

## 2020-07-30 HISTORY — PX: FOREIGN BODY REMOVAL: SHX962

## 2020-07-30 HISTORY — PX: ESOPHAGOGASTRODUODENOSCOPY (EGD) WITH PROPOFOL: SHX5813

## 2020-07-30 LAB — RESP PANEL BY RT-PCR (FLU A&B, COVID) ARPGX2
Influenza A by PCR: NEGATIVE
Influenza B by PCR: NEGATIVE
SARS Coronavirus 2 by RT PCR: NEGATIVE

## 2020-07-30 SURGERY — ESOPHAGOGASTRODUODENOSCOPY (EGD) WITH PROPOFOL
Anesthesia: General

## 2020-07-30 MED ORDER — MIDAZOLAM HCL 2 MG/2ML IJ SOLN
INTRAMUSCULAR | Status: AC
  Filled 2020-07-30: qty 2

## 2020-07-30 MED ORDER — GLUCAGON HCL RDNA (DIAGNOSTIC) 1 MG IJ SOLR
1.0000 mg | Freq: Once | INTRAMUSCULAR | Status: AC
  Administered 2020-07-30: 1 mg via INTRAVENOUS
  Filled 2020-07-30: qty 1

## 2020-07-30 MED ORDER — SUCCINYLCHOLINE CHLORIDE 200 MG/10ML IV SOSY
PREFILLED_SYRINGE | INTRAVENOUS | Status: DC | PRN
  Administered 2020-07-30: 100 mg via INTRAVENOUS

## 2020-07-30 MED ORDER — DEXAMETHASONE SODIUM PHOSPHATE 10 MG/ML IJ SOLN
INTRAMUSCULAR | Status: DC | PRN
  Administered 2020-07-30: 10 mg via INTRAVENOUS

## 2020-07-30 MED ORDER — LACTATED RINGERS IV SOLN: INTRAVENOUS | Status: DC | PRN

## 2020-07-30 MED ORDER — EPHEDRINE SULFATE-NACL 50-0.9 MG/10ML-% IV SOSY
PREFILLED_SYRINGE | INTRAVENOUS | Status: DC | PRN
  Administered 2020-07-30 (×3): 10 mg via INTRAVENOUS

## 2020-07-30 MED ORDER — FENTANYL CITRATE (PF) 100 MCG/2ML IJ SOLN
INTRAMUSCULAR | Status: DC | PRN
  Administered 2020-07-30 (×2): 50 ug via INTRAVENOUS

## 2020-07-30 MED ORDER — ONDANSETRON HCL 4 MG/2ML IJ SOLN
INTRAMUSCULAR | Status: DC | PRN
  Administered 2020-07-30: 4 mg via INTRAVENOUS

## 2020-07-30 MED ORDER — PROPOFOL 10 MG/ML IV BOLUS
INTRAVENOUS | Status: DC | PRN
  Administered 2020-07-30: 150 mg via INTRAVENOUS

## 2020-07-30 MED ORDER — LIDOCAINE 2% (20 MG/ML) 5 ML SYRINGE
INTRAMUSCULAR | Status: DC | PRN
  Administered 2020-07-30: 80 mg via INTRAVENOUS

## 2020-07-30 MED ORDER — FENTANYL CITRATE (PF) 100 MCG/2ML IJ SOLN
INTRAMUSCULAR | Status: AC
  Filled 2020-07-30: qty 2

## 2020-07-30 MED ORDER — MIDAZOLAM HCL 5 MG/5ML IJ SOLN
INTRAMUSCULAR | Status: DC | PRN
  Administered 2020-07-30: 2 mg via INTRAVENOUS

## 2020-07-30 SURGICAL SUPPLY — 14 items

## 2020-07-30 NOTE — Telephone Encounter (Signed)
Pt states he has food stuck in his throat and cannot get it to move up or down. He has tried to drink water but it is coming back up. Pt has had this happen before and had to have it removed. Pt instructed to go to the ER at [facility]. He verbalized understanding. Alonza Bogus PA and Dr. Henrene Pastor notified.

## 2020-07-30 NOTE — Op Note (Signed)
[facility] Patient Name: Raymond Mccarthy Procedure Date: 07/30/2020 MRN: [id number] Attending MD: Docia Chuck. Henrene Pastor MD, MD Date of Birth: 03-18-1966 CSN: [id number] Age: 54 Admit Type: Outpatient Procedure:                Upper GI endoscopy with endoscopic removal of meat                            impaction Indications:              Foreign body in the esophagus. Meat impaction from                            yesterday (greater than 30 hours ago). Has had in                            the past. Providers:                Docia Chuck. Henrene Pastor MD, MD, Clyde Lundborg, RN, William Dalton, Technician Referring MD:             EDP Medicines:                Monitored Anesthesia Care Complications:            No immediate complications. Estimated Blood Loss:     Estimated blood loss: none. Procedure:                Pre-Anesthesia Assessment:                           - Prior to the procedure, a History and Physical                            was performed, and patient medications and                            allergies were reviewed. The patient's tolerance of                            previous anesthesia was also reviewed. The risks                            and benefits of the procedure and the sedation                            options and risks were discussed with the patient.                            All questions were answered, and informed consent                            was obtained. Prior Anticoagulants: The patient has  taken no previous anticoagulant or antiplatelet                            agents. ASA Grade Assessment: I - A normal, healthy                            patient. After reviewing the risks and benefits,                            the patient was deemed in satisfactory condition to                            undergo the procedure.                           After obtaining informed consent, the  endoscope was                            passed under direct vision. Throughout the                            procedure, the patient's blood pressure, pulse, and                            oxygen saturations were monitored continuously. The                            [id number]-[id number] ([id number]) Olympus gastroscope was                            introduced through the mouth, and advanced to the                            second part of duodenum. The upper GI endoscopy was                            accomplished without difficulty. The patient                            tolerated the procedure well. Findings:      A large meat bolus was impacted the lower third of the esophagus. This       was quite cumbersome to removed. He required a combination of retrieval       net, suction, and snare cutting. Beneath was a stricture at 40 cm from       the incisors. The mucosa surrounding was macerated.      The stomach was normal.      The examined duodenum was normal save some superficial erosions.      The cardia and gastric fundus were normal on retroflexion. Impression:               1. Large meat bolus in the distal esophagus removed                            endoscopically.  2. Esophageal stricture with underlying macerated                            mucosa                           3. Duodenitis Moderate Sedation:      none Recommendation:           1. Clear liquid diet until Sunday morning.                            Thereafter, soft foods only                           2. Go to the pharmacy tomorrow and pick up                            omeprazole 20 mg. Take 1 daily until your follow-up                           3. Outpatient follow-up with Dr. Pyrtle dilation in                            a few weeks. Please contact our office to schedule                            an appointment at [phone] Procedure Code(s):        --- Professional ---                            43 235, Esophagogastroduodenoscopy, flexible,                            transoral; diagnostic, including collection of                            specimen(s) by brushing or washing, when performed                            (separate procedure) Diagnosis Code(s):        --- Professional ---                           P53.748O, Food in esophagus causing other injury,                            initial encounter                           T18.108A, Unspecified foreign body in esophagus                            causing other injury, initial encounter CPT copyright 2019 American Medical Association. All rights reserved. The codes documented in this report are preliminary and upon coder review may  be revised to meet current compliance requirements. Docia Chuck. Henrene Pastor MD,  MD 07/30/2020 10:11:10 PM This report has been signed electronically. Number of Addenda: 0

## 2020-07-30 NOTE — Discharge Instructions (Signed)
YOU HAD AN ENDOSCOPIC PROCEDURE TODAY: Refer to the procedure report and other information in the discharge instructions given to you for any specific questions about what was found during the examination. If this information does not answer your questions, please call [place] office at [phone] to clarify.   YOU SHOULD EXPECT: Some feelings of bloating in the abdomen. Passage of more gas than usual. Walking can help get rid of the air that was put into your GI tract during the procedure and reduce the bloating. If you had a lower endoscopy (such as a colonoscopy or flexible sigmoidoscopy) you may notice spotting of blood in your stool or on the toilet paper. Some abdominal soreness may be present for a day or two, also.  DIET: Your first meal following the procedure should be a light meal and then it is ok to progress to your normal diet. A half-sandwich or bowl of soup is an example of a good first meal. Heavy or fried foods are harder to digest and may make you feel nauseous or bloated. Drink plenty of fluids but you should avoid alcoholic beverages for 24 hours. If you had a esophageal dilation, please see attached instructions for diet.    ACTIVITY: Your care partner should take you home directly after the procedure. You should plan to take it easy, moving slowly for the rest of the day. You can resume normal activity the day after the procedure however YOU SHOULD NOT DRIVE, use power tools, machinery or perform tasks that involve climbing or major physical exertion for 24 hours (because of the sedation medicines used during the test).   SYMPTOMS TO REPORT IMMEDIATELY: A gastroenterologist can be reached at any hour. Please call [phone]  for any of the following symptoms:   Following upper endoscopy (EGD, EUS, ERCP, esophageal dilation) Vomiting of blood or coffee ground material  New, significant abdominal pain  New, significant chest pain or pain under the shoulder blades  Painful or  persistently difficult swallowing  New shortness of breath  Black, tarry-looking or red, bloody stools  FOLLOW UP:  If any biopsies were taken you will be contacted by phone or by letter within the next 1-3 weeks. Call [phone]  if you have not heard about the biopsies in 3 weeks.  Please also call with any specific questions about appointments or follow up tests.  

## 2020-07-30 NOTE — Consult Note (Signed)
GI CONSULT [facility] Problem: Acute food impaction Referring physician: EDP Dr. Mariane Masters Primary gastroenterologist: Dr. Ulice Dash Pyrtle   HISTORY OF PRESENT ILLNESS:  Raymond Mccarthy is a healthy 54 y.o. male with a history of esophageal eosinophilia with stricturing and food impaction requiring endoscopic attention April 2020 (Dr. Hilarie Fredrickson).  Subsequent outpatient endoscopy with balloon dilation to 18 mm June 2020.  He also underwent colonoscopy at that time.  Patient was to stay on PPI and follow-up in 3 months.  However, he has not followed up since.  He is no longer on PPI therapy.  He does have intermittent solid food dysphagia which is significant at times.  He may have to induce vomiting for relief.  He presents with a food impaction (roast beef) from yesterday afternoon.  He decided to wait until today as the impaction has not passed.  He called the office earlier today but decided to present to the hospital after he finished up work.  He is able to handle his secretions but not water.  I have evaluated him in the emergency room.  He will be set up for urgent endoscopy  REVIEW OF SYSTEMS:  All non-GI ROS negative unless otherwise stated in the HPI.  Past Medical History:  Diagnosis Date  . Eosinophilic esophagitis     Past Surgical History:  Procedure Laterality Date  . BIOPSY  01/01/2019   Procedure: BIOPSY;  Surgeon: Jerene Bears, MD;  Location: [facility] ENDOSCOPY;  Service: Gastroenterology;;  . ESOPHAGOGASTRODUODENOSCOPY N/A 01/01/2019   Procedure: ESOPHAGOGASTRODUODENOSCOPY (EGD);  Surgeon: Jerene Bears, MD;  Location: [facility] ENDOSCOPY;  Service: Gastroenterology;  Laterality: N/A;  . FOREIGN BODY REMOVAL  01/01/2019   Procedure: FOREIGN BODY REMOVAL;  Surgeon: Jerene Bears, MD;  Location: [facility] ENDOSCOPY;  Service: Gastroenterology;;  . UPPER GASTROINTESTINAL ENDOSCOPY      Social History Raymond Mccarthy  reports that he has never smoked. He has never used smokeless  tobacco. He reports previous alcohol use. He reports previous drug use.  family history includes COPD in his mother; Dementia in his father.  No Known Allergies     PHYSICAL EXAMINATION: Vital signs: BP (!) 155/99   Pulse 82   Temp 97.8 F (36.6 C) (Oral)   Resp 18   Ht 5\' 7"  (1.702 m)   Wt 95.3 kg   SpO2 98%   BMI 32.89 kg/m   Constitutional: generally well-appearing, no acute distress Psychiatric: alert and oriented x3, cooperative Eyes: extraocular movements intact, anicteric, conjunctiva pink Mouth: oral pharynx moist, no lesions Neck: supple no lymphadenopathy Cardiovascular: heart regular rate and rhythm, no murmur Lungs: clear to auscultation bilaterally Abdomen: soft, nontender, nondistended, no obvious ascites, no peritoneal signs, normal bowel sounds, no organomegaly Rectal: Omitted Extremities: no clubbing, cyanosis, or lower extremity edema bilaterally Skin: no lesions on visible extremities Neuro: No focal deficits.  Cranial nerves intact  ASSESSMENT:  1.  Acute food impaction 2.  History of esophageal eosinophilia with stricturing and prior food impaction requiring endoscopic attention   PLAN:  1.  Urgent endoscopy with endoscopic relief of acute food impaction.The nature of the procedure, as well as the risks, benefits, and alternatives were carefully and thoroughly reviewed with the patient. Ample time for discussion and questions allowed. The patient understood, was satisfied, and agreed to proceed.  Docia Chuck. Geri Seminole., M.D. [facility] Division of Gastroenterology

## 2020-07-30 NOTE — ED Triage Notes (Signed)
Patient states he was eating a roast and food is stuck in his esophagus. Patient states when he drinks it will sit for a short period and then he throws it back up.

## 2020-07-30 NOTE — Transfer of Care (Signed)
Immediate Anesthesia Transfer of Care Note  Patient: Raymond Mccarthy  Procedure(s) Performed: Procedure(s): ESOPHAGOGASTRODUODENOSCOPY (EGD) WITH PROPOFOL (N/A) FOREIGN BODY REMOVAL  Patient Location: PACU  Anesthesia Type:General  Level of Consciousness: Alert, Awake, Oriented  Airway & Oxygen Therapy: Patient Spontanous Breathing  Post-op Assessment: Report given to RN  Post vital signs: Reviewed and stable  Last Vitals:  Vitals:   07/30/20 2015 07/30/20 2040  BP: 131/69 (!) 152/70  Pulse: 68 79  Resp: (!) 21 17  Temp:  37.1 C  SpO2: 15%     Complications: No apparent anesthesia complications

## 2020-07-30 NOTE — ED Provider Notes (Signed)
[facility] DEPT Provider Note   CSN: [id number] Arrival date & time: 07/30/20  3662     History Chief Complaint  Patient presents with  . food stuck in throat    Raymond Mccarthy is a 54 y.o. male past medical history of esophageal stricture, presenting for food impaction that began yesterday evening with dinner.  He states he was eating a roast that got stuck in his esophagus.  He is tolerating his saliva though states he is unable to keep any water down, shortly after he drinks that he regurgitates it.  He is having no significant discomfort.  This is happened in the past, followed by Dr. Hilarie Fredrickson with [facility].  He states he has needed his esophagus dilated in the past.  Per chart review, patient contacted [facility] today and was instructed to report to ED.  The history is provided by the patient.       Past Medical History:  Diagnosis Date  . Eosinophilic esophagitis     Patient Active Problem List   Diagnosis Date Noted  . Esophageal obstruction due to food impaction   . Esophagitis   . Gastritis and duodenitis     Past Surgical History:  Procedure Laterality Date  . BIOPSY  01/01/2019   Procedure: BIOPSY;  Surgeon: Jerene Bears, MD;  Location: [facility] ENDOSCOPY;  Service: Gastroenterology;;  . ESOPHAGOGASTRODUODENOSCOPY N/A 01/01/2019   Procedure: ESOPHAGOGASTRODUODENOSCOPY (EGD);  Surgeon: Jerene Bears, MD;  Location: [facility] ENDOSCOPY;  Service: Gastroenterology;  Laterality: N/A;  . FOREIGN BODY REMOVAL  01/01/2019   Procedure: FOREIGN BODY REMOVAL;  Surgeon: Jerene Bears, MD;  Location: [facility] ENDOSCOPY;  Service: Gastroenterology;;  . UPPER GASTROINTESTINAL ENDOSCOPY         Family History  Problem Relation Age of Onset  . COPD Mother   . Dementia Father   . Colon cancer Neg Hx   . Esophageal cancer Neg Hx   . Stomach cancer Neg Hx   . Pancreatic cancer Neg Hx   . Liver disease Neg Hx   . Colon polyps Neg Hx   . Rectal cancer  Neg Hx     Social History   Tobacco Use  . Smoking status: Never Smoker  . Smokeless tobacco: Never Used  Vaping Use  . Vaping Use: Never used  Substance Use Topics  . Alcohol use: Not Currently  . Drug use: Not Currently    Home Medications Prior to Admission medications   Medication Sig Start Date End Date Taking? Authorizing Provider  fluticasone (FLONASE) 50 MCG/ACT nasal spray 2 sprays in each nare twice a day.  2 sprays are swallowed not inhaled for 6 weeks. Patient not taking: Reported on 07/30/2020 02/14/19   Jerene Bears, MD  pantoprazole (PROTONIX) 40 MG tablet Take 1 tablet (40 mg total) by mouth daily. Patient not taking: Reported on 07/30/2020 01/27/19   Pyrtle, Lajuan Lines, MD    Allergies    Patient has no known allergies.  Review of Systems   Review of Systems  All other systems reviewed and are negative.   Physical Exam Updated Vital Signs BP (!) 152/70   Pulse 79   Temp 98.8 F (37.1 C) (Oral)   Resp 17   Ht 5\' 7"  (1.702 m)   Wt 95.3 kg   SpO2 94%   BMI 32.91 kg/m   Physical Exam Vitals and nursing note reviewed.  Constitutional:      General: He is not in acute  distress.    Appearance: He is well-developed.     Comments: Tolerating secretions  HENT:     Head: Normocephalic and atraumatic.  Eyes:     Conjunctiva/sclera: Conjunctivae normal.  Cardiovascular:     Rate and Rhythm: Normal rate and regular rhythm.  Pulmonary:     Effort: Pulmonary effort is normal. No respiratory distress.     Breath sounds: Normal breath sounds.  Abdominal:     General: Bowel sounds are normal.     Palpations: Abdomen is soft.     Tenderness: There is no abdominal tenderness.  Skin:    General: Skin is warm.  Neurological:     Mental Status: He is alert.  Psychiatric:        Behavior: Behavior normal.     ED Results / Procedures / Treatments   Labs (all labs ordered are listed, but only abnormal results are displayed) Labs Reviewed  RESP PANEL BY  RT-PCR (FLU A&B, COVID) ARPGX2    EKG None  Radiology No results found.  Procedures Procedures (including critical care time)  Medications Ordered in ED Medications  glucagon (human recombinant) (GLUCAGEN) injection 1 mg (1 mg Intravenous Given 07/30/20 1723)    ED Course  I have reviewed the triage vital signs and the nursing notes.  Pertinent labs & imaging results that were available during my care of the patient were reviewed by me and considered in my medical decision making (see chart for details).  Clinical Course as of Jul 30 2141  Fri Jul 30, 2020  1757 Patient reevaluated and p.o. challenged by myself.  He states the water does not feel like it has gone down.  Will consult GI for further management.   [JR]  1930 Dr. Henrene Pastor with GI to bring patient to endoscopy for disimpaction.   [JR]    Clinical Course User Index [JR] Janita Camberos, [place] N, PA-C   MDM Rules/Calculators/A&P                          Patient presenting to the ED for esophageal food impaction since yesterday evening. He states he was eating a pot roast and the food got stuck. He is able to tolerate his saliva however is unable to swallow any liquids. No significant pain reported. History of the same requiring esophageal dilation, followed by Dr. Hilarie Fredrickson with [facility]. Patient given IV glucagon here without relief of symptoms. Consult placed to GI, Dr. Henrene Pastor to perform EGD for food disimpaction and disposition following procedure.  Final Clinical Impression(s) / ED Diagnoses Final diagnoses:  Esophageal obstruction due to food impaction    Rx / DC Orders ED Discharge Orders    None       Tiera Mensinger, [place] N, PA-C 07/30/20 2142    Varney Biles, MD 07/31/20 1345

## 2020-07-30 NOTE — Anesthesia Procedure Notes (Signed)
Procedure Name: Intubation Date/Time: 07/30/2020 8:57 PM Performed by: Gerald Leitz, CRNA Pre-anesthesia Checklist: Patient identified, Patient being monitored, Timeout performed, Emergency Drugs available and Suction available Patient Re-evaluated:Patient Re-evaluated prior to induction Oxygen Delivery Method: Circle system utilized Preoxygenation: Pre-oxygenation with 100% oxygen Induction Type: IV induction Ventilation: Mask ventilation without difficulty Laryngoscope Size: Mac and 3 Grade View: Grade I Tube type: Oral Tube size: 7.5 mm Number of attempts: 1 Airway Equipment and Method: Stylet Placement Confirmation: ETT inserted through vocal cords under direct vision,  positive ETCO2 and breath sounds checked- equal and bilateral Secured at: 22 cm Tube secured with: Tape Dental Injury: Teeth and Oropharynx as per pre-operative assessment

## 2020-07-30 NOTE — Anesthesia Preprocedure Evaluation (Signed)
Anesthesia Evaluation  Patient identified by MRN, date of birth, ID band Patient awake    Reviewed: Allergy & Precautions, NPO status , Patient's Chart, lab work & pertinent test results  Airway Mallampati: II  TM Distance: >3 FB Neck ROM: Full    Dental no notable dental hx.    Pulmonary neg pulmonary ROS,    Pulmonary exam normal breath sounds clear to auscultation       Cardiovascular negative cardio ROS Normal cardiovascular exam Rhythm:Regular Rate:Normal     Neuro/Psych negative neurological ROS  negative psych ROS   GI/Hepatic Neg liver ROS, Eosinophilic esophagitis   Endo/Other  negative endocrine ROS  Renal/GU negative Renal ROS  negative genitourinary   Musculoskeletal negative musculoskeletal ROS (+)   Abdominal   Peds negative pediatric ROS (+)  Hematology negative hematology ROS (+)   Anesthesia Other Findings   Reproductive/Obstetrics negative OB ROS                             Anesthesia Physical Anesthesia Plan  ASA: II and emergent  Anesthesia Plan: General   Post-op Pain Management:    Induction: Intravenous and Rapid sequence  PONV Risk Score and Plan: 2 and Ondansetron and Dexamethasone  Airway Management Planned: Oral ETT  Additional Equipment:   Intra-op Plan:   Post-operative Plan: Extubation in OR  Informed Consent: I have reviewed the patients History and Physical, chart, labs and discussed the procedure including the risks, benefits and alternatives for the proposed anesthesia with the patient or authorized representative who has indicated his/her understanding and acceptance.     Dental advisory given  Plan Discussed with: CRNA and Surgeon  Anesthesia Plan Comments:         Anesthesia Quick Evaluation

## 2020-07-31 NOTE — Anesthesia Postprocedure Evaluation (Signed)
Anesthesia Post Note  Patient: Dontrel Smethers  Procedure(s) Performed: ESOPHAGOGASTRODUODENOSCOPY (EGD) WITH PROPOFOL (N/A ) FOREIGN BODY REMOVAL     Patient location during evaluation: PACU Anesthesia Type: General Level of consciousness: awake and alert Pain management: pain level controlled Vital Signs Assessment: post-procedure vital signs reviewed and stable Respiratory status: spontaneous breathing, nonlabored ventilation, respiratory function stable and patient connected to nasal cannula oxygen Cardiovascular status: blood pressure returned to baseline and stable Postop Assessment: no apparent nausea or vomiting Anesthetic complications: no   No complications documented.  Last Vitals:  Vitals:   07/30/20 2245 07/30/20 2300  BP: (!) 155/73 (!) 159/72  Pulse: 98 95  Resp: 14 16  Temp: 36.8 C 36.8 C  SpO2: 98% 99%    Last Pain:  Vitals:   07/30/20 2300  TempSrc:   PainSc: 0-No pain                 Garnell Begeman S

## 2020-08-02 ENCOUNTER — Telehealth: Payer: Self-pay | Admitting: Internal Medicine

## 2020-08-02 ENCOUNTER — Telehealth: Payer: Self-pay

## 2020-08-02 ENCOUNTER — Other Ambulatory Visit: Payer: Self-pay

## 2020-08-02 ENCOUNTER — Encounter (HOSPITAL_COMMUNITY): Payer: Self-pay | Admitting: Internal Medicine

## 2020-08-02 DIAGNOSIS — R1319 Other dysphagia: Secondary | ICD-10-CM

## 2020-08-02 NOTE — Telephone Encounter (Signed)
-----   Message from Jerene Bears, MD sent at 08/02/2020  9:50 AM EST ----- Regarding: RE: Needs follow-up EGD (unless this is not okay with patient) Given recent food impaction, can return for EGD with dilation in early Dec in [place] and PPI until then Thanks JMP ----- Message ----- From: Algernon Huxley, RN Sent: 08/02/2020   9:32 AM EST To: Jerene Bears, MD Subject: RE: Needs follow-up                            EGD report from hospital states to follow-up with Dr. Hilarie Fredrickson in 2 weeks. Do you want to see him for an OV or just EGD? ----- Message ----- From: Jerene Bears, MD Sent: 08/02/2020   7:42 AM EST To: Algernon Huxley, RN Subject: FW: Needs follow-up                            Please arrange [place] EGD with dilation as per Dr. Henrene Pastor Thanks JMP ----- Message ----- From: Irene Shipper, MD Sent: 07/31/2020  11:00 AM EST To: Algernon Huxley, RN, Jerene Bears, MD Subject: Needs follow-up                                [place] food impaction. Removed. Diet modified. Daily PPI recommended. You should contact him and set him up for EGD with dilation in [place] in a few weeks. ThanksJ.P.

## 2020-08-02 NOTE — Telephone Encounter (Signed)
Left message for pt to call back  °

## 2020-08-02 NOTE — Telephone Encounter (Signed)
See additional note. 

## 2020-08-02 NOTE — Telephone Encounter (Signed)
Pt is requesting a call back from a nurse to discuss a medication he was supposed to be prescribed but pt has not received anything

## 2020-08-02 NOTE — Telephone Encounter (Signed)
Linda-Looks like you already have a call into this patient.

## 2020-08-02 NOTE — Telephone Encounter (Signed)
Spoke with pt and he is aware of procedure appt. Pt knows to be NPO after midnight. Referral in epic.

## 2020-08-02 NOTE — Telephone Encounter (Signed)
I spoke with Rhonin and told him to get over the counter omeprazole 20mg  and take one a day until his procedure with Dr Hilarie Fredrickson on 08/23/20. This is what his EGD procedure report indicated.

## 2020-08-02 NOTE — Telephone Encounter (Signed)
Left message for pt to call back regarding repeat egd with dil.

## 2020-08-23 ENCOUNTER — Encounter: Payer: 59 | Admitting: Internal Medicine

## 2020-10-04 ENCOUNTER — Telehealth: Payer: Self-pay | Admitting: Internal Medicine

## 2020-10-04 NOTE — Telephone Encounter (Signed)
Dr. Hilarie Fredrickson has an opening at 10am, 2:30pm and 3pm on 10/07/20. Please offer one of these slots to the pt.

## 2020-10-04 NOTE — Telephone Encounter (Signed)
Pt is calling to reschedule his ENDO [facility] appt, no appts available at this time.

## 2020-10-11 ENCOUNTER — Encounter: Payer: Self-pay | Admitting: Internal Medicine

## 2020-11-08 ENCOUNTER — Other Ambulatory Visit: Payer: Self-pay

## 2020-11-08 ENCOUNTER — Ambulatory Visit (AMBULATORY_SURGERY_CENTER): Payer: Self-pay

## 2020-11-08 VITALS — Ht 67.0 in | Wt 208.0 lb

## 2020-11-08 DIAGNOSIS — R1319 Other dysphagia: Secondary | ICD-10-CM

## 2020-11-08 NOTE — Progress Notes (Signed)
No allergies to soy or egg Pt is not on blood thinners or diet pills Denies issues with sedation/intubation has only had endoscopies  Denies atrial flutter/fib Denies constipation   Emmi instructions given to pt  Pt is aware of Covid safety and care partner requirements.

## 2020-11-16 ENCOUNTER — Encounter: Payer: Self-pay | Admitting: Internal Medicine

## 2020-12-02 ENCOUNTER — Encounter: Payer: Self-pay | Admitting: Internal Medicine

## 2020-12-02 ENCOUNTER — Other Ambulatory Visit: Payer: Self-pay | Admitting: Internal Medicine

## 2020-12-02 ENCOUNTER — Other Ambulatory Visit: Payer: Self-pay

## 2020-12-02 ENCOUNTER — Ambulatory Visit (AMBULATORY_SURGERY_CENTER): Payer: 59 | Admitting: Internal Medicine

## 2020-12-02 VITALS — BP 120/69 | HR 63 | Temp 97.5°F | Resp 15 | Ht 67.0 in | Wt 208.0 lb

## 2020-12-02 DIAGNOSIS — K2970 Gastritis, unspecified, without bleeding: Secondary | ICD-10-CM

## 2020-12-02 DIAGNOSIS — K269 Duodenal ulcer, unspecified as acute or chronic, without hemorrhage or perforation: Secondary | ICD-10-CM

## 2020-12-02 DIAGNOSIS — K200 Eosinophilic esophagitis: Secondary | ICD-10-CM

## 2020-12-02 DIAGNOSIS — R1319 Other dysphagia: Secondary | ICD-10-CM

## 2020-12-02 DIAGNOSIS — K219 Gastro-esophageal reflux disease without esophagitis: Secondary | ICD-10-CM | POA: Diagnosis not present

## 2020-12-02 DIAGNOSIS — K2950 Unspecified chronic gastritis without bleeding: Secondary | ICD-10-CM | POA: Diagnosis not present

## 2020-12-02 MED ORDER — PANTOPRAZOLE SODIUM 40 MG PO TBEC: DELAYED_RELEASE_TABLET | ORAL | 3 refills | Status: DC

## 2020-12-02 MED ORDER — SODIUM CHLORIDE 0.9 % IV SOLN: 500.0000 mL | Freq: Once | INTRAVENOUS | Status: DC

## 2020-12-02 NOTE — Op Note (Signed)
[facility] Patient Name: Raymond Mccarthy Procedure Date: 12/02/2020 10:27 AM MRN: [id number] Endoscopist: Jerene Bears , MD Age: 55 Referring MD:  Date of Birth: Dec 11, 1965 Gender: Male Account #: [id number] Procedure:                Upper GI endoscopy Indications:              Dysphagia in patient with history of GERD and EoE,                            food impaction Nov 2021 requiring EGD, last                            dilation 2020 to 16 mm Medicines:                Monitored Anesthesia Care Procedure:                Pre-Anesthesia Assessment:                           - Prior to the procedure, a History and Physical                            was performed, and patient medications and                            allergies were reviewed. The patient's tolerance of                            previous anesthesia was also reviewed. The risks                            and benefits of the procedure and the sedation                            options and risks were discussed with the patient.                            All questions were answered, and informed consent                            was obtained. Prior Anticoagulants: The patient has                            taken no previous anticoagulant or antiplatelet                            agents. ASA Grade Assessment: II - A patient with                            mild systemic disease. After reviewing the risks                            and benefits, the patient was deemed in  satisfactory condition to undergo the procedure.                           After obtaining informed consent, the endoscope was                            passed under direct vision. Throughout the                            procedure, the patient's blood pressure, pulse, and                            oxygen saturations were monitored continuously. The                            Endoscope was introduced through the  mouth, and                            advanced to the second part of duodenum. The upper                            GI endoscopy was accomplished without difficulty.                            The patient tolerated the procedure well. Scope In: Scope Out: Findings:                 Mucosal changes including longitudinal furrows,                            white plaques and stenosis were found in the middle                            third of the esophagus and in the lower third of                            the esophagus. Esophageal findings were graded                            using the Eosinophilic Esophagitis Endoscopic                            Reference Score (EoE-EREFS) as: Edema Grade 1                            Present (decreased clarity or absence of vascular                            markings), Rings Grade 1 Mild (subtle                            circumferential ridges seen on esophageal  distension), Exudates Grade 1 Mild (scattered white                            lesions involving less than 10 percent of the                            esophageal surface area), Furrows Grade 1 Present                            (vertical lines with or without visible depth) and                            Stricture present at GEJ. Biopsies were obtained                            from the proximal and distal esophagus with cold                            forceps for histology of suspected eosinophilic                            esophagitis. A TTS dilator was passed through the                            scope. Dilation with a 13.5-14.5-15.5 mm balloon                            dilator was performed to 14.5 mm (after 13.5 mm).                            The dilation site was examined and showed moderate                            mucosal disruption and moderate improvement in                            luminal narrowing.                           The cardia and  gastric fundus were normal on                            retroflexion.                           The entire examined stomach was normal. Biopsies                            were taken with a cold forceps for histology and                            Helicobacter pylori testing.  One non-bleeding cratered duodenal ulcer with no                            stigmata of bleeding was found in the duodenal bulb                            near the duodenal sweep. Surrounding bulbar                            duodenitis. The lesion was 9 mm in largest                            dimension.                           The second portion of the duodenum was normal. Complications:            No immediate complications. Estimated Blood Loss:     Estimated blood loss was minimal. Impression:               - Esophageal mucosal changes consistent with                            eosinophilic esophagitis. Biopsied. Dilated to 14.5                            mm with balloon.                           - Normal stomach. Biopsied.                           - Non-bleeding duodenal ulcer with no stigmata of                            bleeding.                           - Normal second portion of the duodenum. Recommendation:           - Patient has a contact number available for                            emergencies. The signs and symptoms of potential                            delayed complications were discussed with the                            patient. Return to normal activities tomorrow.                            Written discharge instructions were provided to the                            patient.                           -  Resume previous diet.                           - Continue present medications.                           - Begin pantoprazole 40 mg twice daily x 1 month,                            then once daily thereafter likely indefinitely                             given EoE and stricture.                           - Avoid aspirin, ibuprofen, naproxen, or other                            non-steroidal anti-inflammatory drugs during                            duodenal ulcer.                           - Await pathology results.                           - Repeat upper endoscopy in 2 months to check                            healing and for retreatment/repeat dilation. Jerene Bears, MD 12/02/2020 10:56:12 AM This report has been signed electronically.

## 2020-12-02 NOTE — Progress Notes (Signed)
Pt's states no medical or surgical changes since previsit or office visit.  VS taken by [facility]

## 2020-12-02 NOTE — Progress Notes (Signed)
pt tolerated well. VSS. awake and to recovery. Report given to RN. Bite block left insitu to recovery. 

## 2020-12-02 NOTE — Progress Notes (Signed)
Called to room to assist during endoscopic procedure.  Patient ID and intended procedure confirmed with present staff. Received instructions for my participation in the procedure from the performing physician.  

## 2020-12-02 NOTE — Patient Instructions (Signed)
You may resume your previous diet and medication schedule.  Thank you for allowing [place] to care for you today!!!   YOU HAD AN ENDOSCOPIC PROCEDURE TODAY AT [place]:   Refer to the procedure report that was given to you for any specific questions about what was found during the examination.  If the procedure report does not answer your questions, please call your gastroenterologist to clarify.  If you requested that your care partner not be given the details of your procedure findings, then the procedure report has been included in a sealed envelope for you to review at your convenience later.  YOU SHOULD EXPECT: Some feelings of bloating in the abdomen. Passage of more gas than usual.  Walking can help get rid of the air that was put into your GI tract during the procedure and reduce the bloating. If you had a lower endoscopy (such as a colonoscopy or flexible sigmoidoscopy) you may notice spotting of blood in your stool or on the toilet paper. If you underwent a bowel prep for your procedure, you may not have a normal bowel movement for a few days.  Please Note:  You might notice some irritation and congestion in your nose or some drainage.  This is from the oxygen used during your procedure.  There is no need for concern and it should clear up in a day or so.  SYMPTOMS TO REPORT IMMEDIATELY:   Following upper endoscopy (EGD)  Vomiting of blood or coffee ground material  New chest pain or pain under the shoulder blades  Painful or persistently difficult swallowing  New shortness of breath  Fever of 100F or higher  Black, tarry-looking stools  For urgent or emergent issues, a gastroenterologist can be reached at any hour by calling [phone]. Do not use MyChart messaging for urgent concerns.    DIET:  We do recommend a small meal at first, but then you may proceed to your regular diet.  Drink plenty of fluids but you should avoid alcoholic beverages for 24  hours.  ACTIVITY:  You should plan to take it easy for the rest of today and you should NOT DRIVE or use heavy machinery until tomorrow (because of the sedation medicines used during the test).    FOLLOW UP: Our staff will call the number listed on your records 48-72 hours following your procedure to check on you and address any questions or concerns that you may have regarding the information given to you following your procedure. If we do not reach you, we will leave a message.  We will attempt to reach you two times.  During this call, we will ask if you have developed any symptoms of COVID 19. If you develop any symptoms (ie: fever, flu-like symptoms, shortness of breath, cough etc.) before then, please call [phone].  If you test positive for Covid 19 in the 2 weeks post procedure, please call and report this information to [place].    If any biopsies were taken you will be contacted by phone or by letter within the next 1-3 weeks.  Please call us at [phone] if you have not heard about the biopsies in 3 weeks.    SIGNATURES/CONFIDENTIALITY: You and/or your care partner have signed paperwork which will be entered into your electronic medical record.  These signatures attest to the fact that that the information above on your After Visit Summary has been reviewed and is understood.  Full responsibility of the confidentiality of this  discharge information lies with you and/or your care-partner. 

## 2020-12-06 ENCOUNTER — Telehealth: Payer: Self-pay | Admitting: *Deleted

## 2020-12-06 NOTE — Telephone Encounter (Signed)
  Follow up Call-  Call back number 12/02/2020 02/12/2019  Post procedure Call Back phone  # [phone]  Permission to leave phone message Yes Yes     Patient questions:  Do you have a fever, pain , or abdominal swelling? No. Pain Score  0 *  Have you tolerated food without any problems? Yes.    Have you been able to return to your normal activities? Yes.    Do you have any questions about your discharge instructions: Diet   No. Medications  No. Follow up visit  No.  Do you have questions or concerns about your Care? No.  Actions: * If pain score is 4 or above: No action needed, pain <4.  1. Have you developed a fever since your procedure? no  2.   Have you had an respiratory symptoms (SOB or cough) since your procedure? no  3.   Have you tested positive for COVID 19 since your procedure no  4.   Have you had any family members/close contacts diagnosed with the COVID 19 since your procedure?  no   If yes to any of these questions please route to Joylene John, RN and Joella Prince, RN

## 2020-12-16 ENCOUNTER — Encounter: Payer: Self-pay | Admitting: Internal Medicine

## 2021-01-27 ENCOUNTER — Ambulatory Visit (AMBULATORY_SURGERY_CENTER): Payer: 59 | Admitting: Internal Medicine

## 2021-01-27 ENCOUNTER — Other Ambulatory Visit: Payer: Self-pay

## 2021-01-27 ENCOUNTER — Encounter: Payer: Self-pay | Admitting: Internal Medicine

## 2021-01-27 VITALS — BP 133/75 | HR 66 | Temp 98.6°F | Resp 20 | Ht 67.0 in | Wt 208.0 lb

## 2021-01-27 DIAGNOSIS — K269 Duodenal ulcer, unspecified as acute or chronic, without hemorrhage or perforation: Secondary | ICD-10-CM

## 2021-01-27 DIAGNOSIS — K2100 Gastro-esophageal reflux disease with esophagitis, without bleeding: Secondary | ICD-10-CM | POA: Diagnosis not present

## 2021-01-27 DIAGNOSIS — K2980 Duodenitis without bleeding: Secondary | ICD-10-CM | POA: Diagnosis not present

## 2021-01-27 DIAGNOSIS — K222 Esophageal obstruction: Secondary | ICD-10-CM

## 2021-01-27 DIAGNOSIS — K209 Esophagitis, unspecified without bleeding: Secondary | ICD-10-CM

## 2021-01-27 MED ORDER — SODIUM CHLORIDE 0.9 % IV SOLN: 500.0000 mL | Freq: Once | INTRAVENOUS | Status: DC

## 2021-01-27 NOTE — Progress Notes (Signed)
VS by NS. 

## 2021-01-27 NOTE — Progress Notes (Signed)
Called to room to assist during endoscopic procedure.  Patient ID and intended procedure confirmed with present staff. Received instructions for my participation in the procedure from the performing physician.  

## 2021-01-27 NOTE — Op Note (Signed)
[facility] Patient Name: Raymond Mccarthy Procedure Date: 01/27/2021 9:56 AM MRN: [id number] Endoscopist: Jerene Bears , MD Age: 55 Referring MD:  Date of Birth: [birth year] Gender: Male Account #: [id number] Procedure:                Upper GI endoscopy Indications:              Follow-up of reflux esophagitis, For therapy of                            esophageal stenosis, follow-up of duodenal ulcer                            seen at last EGD in March 2022 (esophageal dilation                            to 14.5 mm at that time) Medicines:                Monitored Anesthesia Care Procedure:                Pre-Anesthesia Assessment:                           - Prior to the procedure, a History and Physical                            was performed, and patient medications and                            allergies were reviewed. The patient's tolerance of                            previous anesthesia was also reviewed. The risks                            and benefits of the procedure and the sedation                            options and risks were discussed with the patient.                            All questions were answered, and informed consent                            was obtained. Prior Anticoagulants: The patient has                            taken no previous anticoagulant or antiplatelet                            agents. ASA Grade Assessment: II - A patient with                            mild systemic disease. After reviewing the risks  and benefits, the patient was deemed in                            satisfactory condition to undergo the procedure.                           After obtaining informed consent, the endoscope was                            passed under direct vision. Throughout the                            procedure, the patient's blood pressure, pulse, and                            oxygen saturations were monitored  continuously. The                            [id number] #[id number] was introduced through the                            mouth, and advanced to the second part of duodenum.                            The upper GI endoscopy was accomplished without                            difficulty. The patient tolerated the procedure                            well. Scope In: Scope Out: Findings:                 Mildly esophagitis with no bleeding was found in                            the middle and lower third of the esophagus. This                            has the appearance of EoE (linear furrowing,                            circumferential rings and white speaks), but                            biopsies 2 months ago most consistent with reflux                            esophagitis.                           One benign-appearing, intrinsic moderate                            (circumferential scarring or stenosis; an endoscope  may pass) stenosis was found 40 cm from the                            incisors (at [facility]). This stenosis measured 1.3 cm                            (inner diameter) x less than one cm (in length).                            The stenosis was traversed. A TTS dilator was                            passed through the scope. Dilation with a 16-17-18                            mm balloon dilator was performed to 17 mm. The                            dilation site was examined and showed moderate                            mucosal disruption.                           The entire examined stomach was normal.                           Mild inflammation characterized by erythema was                            found in the duodenal bulb. The previous seen                            duodenal ulcer has healed with PPI.                           The second portion of the duodenum was normal. Complications:            No immediate complications. Estimated  Blood Loss:     Estimated blood loss was minimal. Impression:               - Mildly reflux esophagitis with no bleeding.                           - Benign-appearing esophageal stenosis at GE                            junction. Dilated to 17 mm with balloon.                           - Normal stomach.                           - Mild duodenitis. Previously seen duodenal ulcer  has healed.                           - Normal second portion of the duodenum.                           - No specimens collected. Recommendation:           - Patient has a contact number available for                            emergencies. The signs and symptoms of potential                            delayed complications were discussed with the                            patient. Return to normal activities tomorrow.                            Written discharge instructions were provided to the                            patient.                           - Advance diet as tolerated.                           - Continue present medications.                           - Daily pantoprazole 40 mg is recommended                            indefinitely given history of recurrent                            esophagitis, esophageal stricture and peptic ulcer                            disease.                           - Avoid NSAIDs.                           - Repeat upper endoscopy as needed for retreatment.                           - Office follow-up in approximately 1 year. Jerene Bears, MD 01/27/2021 10:24:59 AM This report has been signed electronically.

## 2021-01-27 NOTE — Patient Instructions (Signed)
Handouts given for Post esophageal dilation diet, esophagitis.  Take daily pantoprazole 40 mg indefinietly.  Avoid NSAIDS (naproxen, aleve, ibuprofen, aspirin)  Indefinitely.  Take Tylenol if needed.  Office appointment in 1 year, office will arrange.  YOU HAD AN ENDOSCOPIC PROCEDURE TODAY AT [facility]:   Refer to the procedure report that was given to you for any specific questions about what was found during the examination.  If the procedure report does not answer your questions, please call your gastroenterologist to clarify.  If you requested that your care partner not be given the details of your procedure findings, then the procedure report has been included in a sealed envelope for you to review at your convenience later.  YOU SHOULD EXPECT: Some feelings of bloating in the abdomen. Passage of more gas than usual.  Walking can help get rid of the air that was put into your GI tract during the procedure and reduce the bloating. If you had a lower endoscopy (such as a colonoscopy or flexible sigmoidoscopy) you may notice spotting of blood in your stool or on the toilet paper. If you underwent a bowel prep for your procedure, you may not have a normal bowel movement for a few days.  Please Note:  You might notice some irritation and congestion in your nose or some drainage.  This is from the oxygen used during your procedure.  There is no need for concern and it should clear up in a day or so.  SYMPTOMS TO REPORT IMMEDIATELY:   Following upper endoscopy (EGD)  Vomiting of blood or coffee ground material  New chest pain or pain under the shoulder blades  Painful or persistently difficult swallowing  New shortness of breath  Fever of 100F or higher  Black, tarry-looking stools  For urgent or emergent issues, a gastroenterologist can be reached at any hour by calling [phone]. Do not use MyChart messaging for urgent concerns.    DIET:  Post dilation  diet  ACTIVITY:  You should plan to take it easy for the rest of today and you should NOT DRIVE or use heavy machinery until tomorrow (because of the sedation medicines used during the test).    FOLLOW UP: Our staff will call the number listed on your records 48-72 hours following your procedure to check on you and address any questions or concerns that you may have regarding the information given to you following your procedure. If we do not reach you, we will leave a message.  We will attempt to reach you two times.  During this call, we will ask if you have developed any symptoms of COVID 19. If you develop any symptoms (ie: fever, flu-like symptoms, shortness of breath, cough etc.) before then, please call [phone].  If you test positive for Covid 19 in the 2 weeks post procedure, please call and report this information to [place].    If any biopsies were taken you will be contacted by phone or by letter within the next 1-3 weeks.  Please call us at [phone] if you have not heard about the biopsies in 3 weeks.    SIGNATURES/CONFIDENTIALITY: You and/or your care partner have signed paperwork which will be entered into your electronic medical record.  These signatures attest to the fact that that the information above on your After Visit Summary has been reviewed and is understood.  Full responsibility of the confidentiality of this discharge information lies with you and/or your care-partner.

## 2021-01-31 ENCOUNTER — Telehealth: Payer: Self-pay | Admitting: *Deleted

## 2021-01-31 NOTE — Telephone Encounter (Signed)
  Follow up Call-  Call back number 01/27/2021 12/02/2020 02/12/2019  Post procedure Call Back phone  # [phone]  Permission to leave phone message Yes Yes Yes     Patient questions:  Do you have a fever, pain , or abdominal swelling? No. Pain Score  0 *  Have you tolerated food without any problems? Yes.    Have you been able to return to your normal activities? Yes.    Do you have any questions about your discharge instructions: Diet   No. Medications  No. Follow up visit  No.  Do you have questions or concerns about your Care? No.  Actions: * If pain score is 4 or above: No action needed, pain <4.  1. Have you developed a fever since your procedure? no  2.   Have you had an respiratory symptoms (SOB or cough) since your procedure? no  3.   Have you tested positive for COVID 19 since your procedure no  4.   Have you had any family members/close contacts diagnosed with the COVID 19 since your procedure?  no   If yes to any of these questions please route to Joylene John, RN and Joella Prince, RN

## 2021-04-19 ENCOUNTER — Other Ambulatory Visit: Payer: Self-pay

## 2021-04-19 ENCOUNTER — Encounter (HOSPITAL_COMMUNITY): Payer: Self-pay | Admitting: Emergency Medicine

## 2021-04-19 ENCOUNTER — Emergency Department (HOSPITAL_COMMUNITY): Payer: 59

## 2021-04-19 ENCOUNTER — Emergency Department (HOSPITAL_COMMUNITY)
Admission: EM | Admit: 2021-04-19 | Discharge: 2021-04-19 | Disposition: A | Discharge status: Home / Self Care | Payer: 59 | Attending: Emergency Medicine | Admitting: Emergency Medicine

## 2021-04-19 ENCOUNTER — Ambulatory Visit
Admission: RE | Admit: 2021-04-19 | Discharge: 2021-04-19 | Disposition: A | Discharge status: Home / Self Care | Payer: 59 | Source: Ambulatory Visit | Attending: Family Medicine | Admitting: Family Medicine

## 2021-04-19 VITALS — BP 152/77 | HR 76 | Temp 97.9°F | Resp 18

## 2021-04-19 DIAGNOSIS — R079 Chest pain, unspecified: Secondary | ICD-10-CM

## 2021-04-19 DIAGNOSIS — R0602 Shortness of breath: Secondary | ICD-10-CM | POA: Diagnosis not present

## 2021-04-19 DIAGNOSIS — R0789 Other chest pain: Secondary | ICD-10-CM | POA: Insufficient documentation

## 2021-04-19 LAB — CBC
HCT: 46 % (ref 39.0–52.0)
Hemoglobin: 15.7 g/dL (ref 13.0–17.0)
MCH: 30.3 pg (ref 26.0–34.0)
MCHC: 34.1 g/dL (ref 30.0–36.0)
MCV: 88.6 fL (ref 80.0–100.0)
Platelets: 288 10*3/uL (ref 150–400)
RBC: 5.19 MIL/uL (ref 4.22–5.81)
RDW: 13 % (ref 11.5–15.5)
WBC: 5.8 10*3/uL (ref 4.0–10.5)
nRBC: 0 % (ref 0.0–0.2)

## 2021-04-19 LAB — TROPONIN I (HIGH SENSITIVITY)
Troponin I (High Sensitivity): 3 ng/L (ref <18)
Troponin I (High Sensitivity): 2 ng/L (ref <18)

## 2021-04-19 LAB — D-DIMER, QUANTITATIVE: D-Dimer, Quant: 0.32 ug/mL-FEU (ref 0.00–0.50)

## 2021-04-19 LAB — BASIC METABOLIC PANEL
Anion gap: 8 (ref 5–15)
BUN: 11 mg/dL (ref 6–20)
CO2: 30 mmol/L (ref 22–32)
Calcium: 9.4 mg/dL (ref 8.9–10.3)
Chloride: 103 mmol/L (ref 98–111)
Creatinine, Ser: 0.97 mg/dL (ref 0.61–1.24)
GFR, Estimated: >60 mL/min (ref >60)
Glucose, Bld: 96 mg/dL (ref 70–99)
Potassium: 3.7 mmol/L (ref 3.5–5.1)
Sodium: 141 mmol/L (ref 135–145)

## 2021-04-19 NOTE — ED Provider Notes (Signed)
[facility] EMERGENCY DEPARTMENT Provider Note  CSN: [id number] Arrival date & time: 04/19/21 1038    History Chief Complaint  Patient presents with   Chest Pain    Raymond Mccarthy is a 55 y.o. male with history of esophagitis s/p dilatation x 2 reports last week while he was driving to the beach he began to have a mild-moderate aching, midsternal chest pain. Non radiating, not associated with cough or fever. No nausea or diaphoresis. He thought maybe he had hurt himself lifting something but pain persisted while he was at the beach and on his return 2 days ago. Yesterday he noted some SOB for the first time and so today he went to [facility] and was subsequently sent to the ED for evaluation. No particular provoking or relieving factors. Denies HTN, DM or HLD.     Past Medical History:  Diagnosis Date   Eosinophilic esophagitis     Past Surgical History:  Procedure Laterality Date   BIOPSY  01/01/2019   Procedure: BIOPSY;  Surgeon: Jerene Bears, MD;  Location: [facility] ENDOSCOPY;  Service: Gastroenterology;;   ESOPHAGOGASTRODUODENOSCOPY N/A 01/01/2019   Procedure: ESOPHAGOGASTRODUODENOSCOPY (EGD);  Surgeon: Jerene Bears, MD;  Location: [facility] ENDOSCOPY;  Service: Gastroenterology;  Laterality: N/A;   ESOPHAGOGASTRODUODENOSCOPY (EGD) WITH PROPOFOL N/A 07/30/2020   Procedure: ESOPHAGOGASTRODUODENOSCOPY (EGD) WITH PROPOFOL;  Surgeon: Irene Shipper, MD;  Location: [facility] ENDOSCOPY;  Service: Endoscopy;  Laterality: N/A;   FOREIGN BODY REMOVAL  01/01/2019   Procedure: FOREIGN BODY REMOVAL;  Surgeon: Jerene Bears, MD;  Location: [facility] ENDOSCOPY;  Service: Gastroenterology;;   FOREIGN BODY REMOVAL  07/30/2020   Procedure: FOREIGN BODY REMOVAL;  Surgeon: Irene Shipper, MD;  Location: [facility] ENDOSCOPY;  Service: Endoscopy;;   UPPER GASTROINTESTINAL ENDOSCOPY      Family History  Problem Relation Age of Onset   COPD Mother    Dementia Father    Colon cancer Neg Hx    Esophageal cancer Neg Hx    Stomach  cancer Neg Hx    Pancreatic cancer Neg Hx    Liver disease Neg Hx    Colon polyps Neg Hx    Rectal cancer Neg Hx     Social History   Tobacco Use   Smoking status: Never   Smokeless tobacco: Never  Vaping Use   Vaping Use: Never used  Substance Use Topics   Alcohol use: Not Currently   Drug use: Not Currently     Home Medications Prior to Admission medications   Medication Sig Start Date End Date Taking? Authorizing Provider  allopurinol (ZYLOPRIM) 300 MG tablet Take 300 mg by mouth daily. 02/13/20  Yes [provider]  pantoprazole (PROTONIX) 40 MG tablet Take 40 mg twice daily for 1 month then once daily 12/02/20  Yes Pyrtle, Lajuan Lines, MD     Allergies    Bee venom and Penicillins   Review of Systems   Review of Systems A comprehensive review of systems was completed and negative except as noted in HPI.    Physical Exam BP (!) 165/88   Pulse 66   Temp 98.2 F (36.8 C) (Oral)   Resp 16   Ht '5\' 7"'$  (1.702 m)   Wt 94.3 kg   SpO2 97%   BMI 32.58 kg/m   Physical Exam Vitals and nursing note reviewed.  Constitutional:      Appearance: Normal appearance.  HENT:     Head: Normocephalic and atraumatic.     Nose: Nose normal.  Mouth/Throat:     Mouth: Mucous membranes are moist.  Eyes:     Extraocular Movements: Extraocular movements intact.     Conjunctiva/sclera: Conjunctivae normal.  Cardiovascular:     Rate and Rhythm: Normal rate.  Pulmonary:     Effort: Pulmonary effort is normal.     Breath sounds: Normal breath sounds.  Abdominal:     General: Abdomen is flat.     Palpations: Abdomen is soft.     Tenderness: There is no abdominal tenderness.  Musculoskeletal:        General: No swelling. Normal range of motion.     Cervical back: Neck supple.  Skin:    General: Skin is warm and dry.  Neurological:     General: No focal deficit present.     Mental Status: He is alert.  Psychiatric:        Mood and Affect: Mood normal.     ED  Results / Procedures / Treatments   Labs (all labs ordered are listed, but only abnormal results are displayed) Labs Reviewed  BASIC METABOLIC PANEL  CBC  D-DIMER, QUANTITATIVE  TROPONIN I (HIGH SENSITIVITY)  TROPONIN I (HIGH SENSITIVITY)    EKG EKG Interpretation  Date/Time:  Tuesday April 19 2021 11:27:10 EDT Ventricular Rate:  65 PR Interval:  174 QRS Duration: 82 QT Interval:  416 QTC Calculation: 432 R Axis:   -2 Text Interpretation: Normal sinus rhythm Normal ECG No significant change since last tracing EARLIER SAME DATE Confirmed by Calvert Cantor [phone]) on 04/19/2021 11:34:57 AM  Radiology DG Chest 2 View  Result Date: 04/19/2021 CLINICAL DATA:  Chest pain and shortness of breath EXAM: CHEST - 2 VIEW COMPARISON:  None. FINDINGS: The heart size and mediastinal contours are within normal limits. Both lungs are clear. The visualized skeletal structures are unremarkable. IMPRESSION: No active cardiopulmonary disease. Electronically Signed   By: Van Clines M.D.   On: 04/19/2021 12:29    Procedures Procedures  Medications Ordered in the ED Medications - No data to display   MDM Rules/Calculators/A&P MDM Patient with atypical chest pain, normal exam. EKG without signs of acute ischemia. Will check labs, CXR and monitor in the ED.   ED Course  I have reviewed the triage vital signs and the nursing notes.  Pertinent labs & imaging results that were available during my care of the patient were reviewed by me and considered in my medical decision making (see chart for details).  Clinical Course as of 04/19/21 1425  Tue Apr 19, 2021  1234 CBC, BMP and Trop #1 are normal.  [CS]  [id number] CXR is clear. Will continue to observe pending Delta Trop [CS]  [id number] Dimer is normal.  [CS]  [id number] Patient remains well appearing. Delta trop is normal. Low risk for CAD, HEART Pathway is 2. Plan discharge with PCP follow up.  [CS]    Clinical Course User Index [CS] Truddie Hidden, MD    Final Clinical Impression(s) / ED Diagnoses Final diagnoses:  Atypical chest pain    Rx / DC Orders ED Discharge Orders     None        Truddie Hidden, MD 04/19/21 1425

## 2021-04-19 NOTE — ED Triage Notes (Signed)
Pt c/o central chest pain since Thurs with SOB starting yesterday; denies n/v

## 2021-04-19 NOTE — ED Notes (Signed)
Pt gone to Xray 

## 2021-04-19 NOTE — ED Provider Notes (Signed)
[facility]    CSN: [id number] Arrival date & time: 04/19/21  [id number]      History   Chief Complaint Chief Complaint  Patient presents with   Chest Pain    HPI Raymond Mccarthy is a 55 y.o. male.   HPI Chest pain, mid-sternal, pressure with sharp, shooting pain across the chest x 5 days. Patient reports symptoms appear to be worsening.  He endorses associated shortness of breath with chest pain. He endorses a family history of cardiovascular disease.  He has no known cardiac history.  He reports recently traveling to the beach and symptoms have progressively worsened over vacation however he did not seek medical attention.  Past Medical History:  Diagnosis Date   Eosinophilic esophagitis     Patient Active Problem List   Diagnosis Date Noted   Esophageal stricture    Esophagitis    Gastritis and duodenitis     Past Surgical History:  Procedure Laterality Date   BIOPSY  01/01/2019   Procedure: BIOPSY;  Surgeon: Jerene Bears, MD;  Location: [facility] ENDOSCOPY;  Service: Gastroenterology;;   ESOPHAGOGASTRODUODENOSCOPY N/A 01/01/2019   Procedure: ESOPHAGOGASTRODUODENOSCOPY (EGD);  Surgeon: Jerene Bears, MD;  Location: [facility] ENDOSCOPY;  Service: Gastroenterology;  Laterality: N/A;   ESOPHAGOGASTRODUODENOSCOPY (EGD) WITH PROPOFOL N/A 07/30/2020   Procedure: ESOPHAGOGASTRODUODENOSCOPY (EGD) WITH PROPOFOL;  Surgeon: Irene Shipper, MD;  Location: [facility] ENDOSCOPY;  Service: Endoscopy;  Laterality: N/A;   FOREIGN BODY REMOVAL  01/01/2019   Procedure: FOREIGN BODY REMOVAL;  Surgeon: Jerene Bears, MD;  Location: [facility] ENDOSCOPY;  Service: Gastroenterology;;   FOREIGN BODY REMOVAL  07/30/2020   Procedure: FOREIGN BODY REMOVAL;  Surgeon: Irene Shipper, MD;  Location: [facility] ENDOSCOPY;  Service: Endoscopy;;   UPPER GASTROINTESTINAL ENDOSCOPY         Home Medications    Prior to Admission medications   Medication Sig Start Date End Date Taking? Authorizing Provider  allopurinol  (ZYLOPRIM) 300 MG tablet Take 300 mg by mouth daily. 02/13/20   [provider]  pantoprazole (PROTONIX) 40 MG tablet Take 40 mg twice daily for 1 month then once daily 12/02/20   Pyrtle, Lajuan Lines, MD    Family History Family History  Problem Relation Age of Onset   COPD Mother    Dementia Father    Colon cancer Neg Hx    Esophageal cancer Neg Hx    Stomach cancer Neg Hx    Pancreatic cancer Neg Hx    Liver disease Neg Hx    Colon polyps Neg Hx    Rectal cancer Neg Hx     Social History Social History   Tobacco Use   Smoking status: Never   Smokeless tobacco: Never  Vaping Use   Vaping Use: Never used  Substance Use Topics   Alcohol use: Not Currently   Drug use: Not Currently     Allergies   Bee venom and Penicillins   Review of Systems Review of Systems Pertinent negatives listed in HPI Physical Exam Triage Vital Signs ED Triage Vitals  Enc Vitals Group     BP 04/19/21 1005 (!) 152/77     Pulse Rate 04/19/21 1005 76     Resp 04/19/21 1005 18     Temp 04/19/21 1005 97.9 F (36.6 C)     Temp Source 04/19/21 1005 Oral     SpO2 04/19/21 1005 98 %     Weight --      Height --  Head Circumference --      Peak Flow --      Pain Score 04/19/21 1006 5     Pain Loc --      Pain Edu? --      Excl. in [place]? --    No data found.  Updated Vital Signs BP (!) 152/77 (BP Location: Right Arm)   Pulse 76   Temp 97.9 F (36.6 C) (Oral)   Resp 18   SpO2 98%   Visual Acuity Right Eye Distance:   Left Eye Distance:   Bilateral Distance:    Right Eye Near:   Left Eye Near:    Bilateral Near:     Physical Exam General appearance: Alert, well developed, well nourished, cooperative  Head: Normocephalic, without obvious abnormality, atraumatic Respiratory: Respirations even and unlabored, normal respiratory rate Heart: Rate and Rhythm normal. No gallop or murmurs noted on exam  Extremities: No gross deformities, negative BLE edema Skin: Skin color,  texture, turgor normal. No rashes seen  Psych: Appropriate mood and affect. Neurologic: GCS 15, normal coordination, normal gait     UC Treatments / Results  Labs (all labs ordered are listed, but only abnormal results are displayed) Labs Reviewed - No data to display  EKG   Radiology No results found.  Procedures Procedures (including critical care time)  Medications Ordered in UC Medications - No data to display  Initial Impression / Assessment and Plan / UC Course  I have reviewed the triage vital signs and the nursing notes.  Pertinent labs & imaging results that were available during my care of the patient were reviewed by me and considered in my medical decision making (see chart for details).    Patient presents today with 4 to 5 days of progressively worsening chest pain.  ECG shows no acute ST changes however given the intensity of pain along with associated shortness of breath.  Patient warrants further work-up in the setting of the emergency department to rule out any acute cardiac dysfunction versus possible PE as a source of pain.  Patient's vital signs are overall stable.  Patient wishes to transport himself ER at [facility] Final Clinical Impressions(s) / UC Diagnoses   Final diagnoses:  Chest pain, unspecified type  SOB (shortness of breath)   Discharge Instructions   None    ED Prescriptions   None    PDMP not reviewed this encounter.   Scot Jun, [place] 04/26/21 [phone]

## 2021-04-19 NOTE — ED Triage Notes (Signed)
Chest pain and shortness of breath across upper chest since Thursday

## 2021-10-06 ENCOUNTER — Emergency Department (HOSPITAL_COMMUNITY)
Admission: EM | Admit: 2021-10-06 | Discharge: 2021-10-06 | Disposition: A | Discharge status: Home / Self Care | Payer: 59 | Attending: Emergency Medicine | Admitting: Emergency Medicine

## 2021-10-06 ENCOUNTER — Emergency Department (HOSPITAL_COMMUNITY): Payer: 59

## 2021-10-06 ENCOUNTER — Encounter (HOSPITAL_COMMUNITY): Payer: Self-pay | Admitting: *Deleted

## 2021-10-06 ENCOUNTER — Other Ambulatory Visit: Payer: Self-pay

## 2021-10-06 DIAGNOSIS — R0789 Other chest pain: Secondary | ICD-10-CM | POA: Insufficient documentation

## 2021-10-06 DIAGNOSIS — R1110 Vomiting, unspecified: Secondary | ICD-10-CM | POA: Diagnosis not present

## 2021-10-06 DIAGNOSIS — I2510 Atherosclerotic heart disease of native coronary artery without angina pectoris: Secondary | ICD-10-CM | POA: Diagnosis not present

## 2021-10-06 LAB — BASIC METABOLIC PANEL
Anion gap: 11 (ref 5–15)
BUN: 11 mg/dL (ref 6–20)
CO2: 26 mmol/L (ref 22–32)
Calcium: 9.3 mg/dL (ref 8.9–10.3)
Chloride: 101 mmol/L (ref 98–111)
Creatinine, Ser: 1.1 mg/dL (ref 0.61–1.24)
GFR, Estimated: >60 mL/min (ref >60)
Glucose, Bld: 81 mg/dL (ref 70–99)
Potassium: 3.6 mmol/L (ref 3.5–5.1)
Sodium: 138 mmol/L (ref 135–145)

## 2021-10-06 LAB — CBC
HCT: 44.8 % (ref 39.0–52.0)
Hemoglobin: 15.6 g/dL (ref 13.0–17.0)
MCH: 31 pg (ref 26.0–34.0)
MCHC: 34.8 g/dL (ref 30.0–36.0)
MCV: 88.9 fL (ref 80.0–100.0)
Platelets: 386 10*3/uL (ref 150–400)
RBC: 5.04 MIL/uL (ref 4.22–5.81)
RDW: 13.3 % (ref 11.5–15.5)
WBC: 7.4 10*3/uL (ref 4.0–10.5)
nRBC: 0 % (ref 0.0–0.2)

## 2021-10-06 LAB — TROPONIN I (HIGH SENSITIVITY)
Troponin I (High Sensitivity): 5 ng/L (ref <18)
Troponin I (High Sensitivity): 5 ng/L (ref <18)

## 2021-10-06 MED ORDER — ASPIRIN 81 MG PO CHEW
324.0000 mg | CHEWABLE_TABLET | Freq: Once | ORAL | Status: AC
  Administered 2021-10-06: 324 mg via ORAL
  Filled 2021-10-06: qty 4

## 2021-10-06 MED ORDER — NITROGLYCERIN 0.4 MG SL SUBL
0.4000 mg | SUBLINGUAL_TABLET | SUBLINGUAL | Status: DC | PRN
  Administered 2021-10-06: 0.4 mg via SUBLINGUAL
  Filled 2021-10-06: qty 1

## 2021-10-06 NOTE — ED Provider Notes (Signed)
[facility] EMERGENCY DEPARTMENT Provider Note  CSN: [id number] Arrival date & time: 10/06/21 1109  History Chief Complaint  Patient presents with   Chest Pain    Raymond Mccarthy is a 57 y.o. male with family history but no personal history of CAD reports onset of sharp L sided chest pain about an hour prior to arrival, became more of a dull aching, pressure pain and was associated with one episode of non bloody emesis. No SOB or diaphoresis. He was seen in the ED for similar pain last summer and had a negative workup, ultimately discharged with outpatient PCP follow up. He did not get any further cardiac testing then and has not had any symptoms since then.    Home Medications Prior to Admission medications   Medication Sig Start Date End Date Taking? Authorizing Provider  allopurinol (ZYLOPRIM) 300 MG tablet Take 300 mg by mouth daily as needed (gout). 02/13/20  Yes [provider]  pantoprazole (PROTONIX) 40 MG tablet Take 40 mg twice daily for 1 month then once daily Patient taking differently: Take 40 mg by mouth daily as needed (gerd). 12/02/20  Yes Pyrtle, Lajuan Lines, MD     Allergies    Bee venom and Penicillins   Review of Systems   Review of Systems Please see HPI for pertinent positives and negatives  Physical Exam BP 136/75    Pulse 67    Temp 97.6 F (36.4 C) (Oral)    Resp 17    SpO2 96%   Physical Exam Vitals and nursing note reviewed.  Constitutional:      Appearance: Normal appearance.  HENT:     Head: Normocephalic and atraumatic.     Nose: Nose normal.     Mouth/Throat:     Mouth: Mucous membranes are moist.  Eyes:     Extraocular Movements: Extraocular movements intact.     Conjunctiva/sclera: Conjunctivae normal.  Cardiovascular:     Rate and Rhythm: Normal rate.  Pulmonary:     Effort: Pulmonary effort is normal.     Breath sounds: Normal breath sounds.  Chest:     Chest wall: Tenderness (L parasternal) present.  Abdominal:      General: Abdomen is flat.     Palpations: Abdomen is soft.     Tenderness: There is no abdominal tenderness.  Musculoskeletal:        General: No swelling. Normal range of motion.     Cervical back: Neck supple.  Skin:    General: Skin is warm and dry.  Neurological:     General: No focal deficit present.     Mental Status: He is alert.  Psychiatric:        Mood and Affect: Mood normal.    ED Results / Procedures / Treatments   EKG EKG Interpretation  Date/Time:  Thursday October 06 2021 11:35:47 EST Ventricular Rate:  79 PR Interval:  160 QRS Duration: 86 QT Interval:  378 QTC Calculation: 434 R Axis:   49 Text Interpretation: Sinus rhythm Rightward axis No significant change since last tracing EARLIER SAME DATE Confirmed by Calvert Cantor [phone]) on 10/06/2021 11:41:38 AM  Procedures Procedures  Medications Ordered in the ED Medications  nitroGLYCERIN (NITROSTAT) SL tablet 0.4 mg (0.4 mg Sublingual Given 10/06/21 1246)  aspirin chewable tablet 324 mg (324 mg Oral Given 10/06/21 1246)    Initial Impression and Plan  Patient with atypical chest pain today. Has a history of esophagitis but has not been having any other  GI symptoms recently. Will check labs, plan delta trop. NSR on monitor now. ASA and NTG for discomfort.   ED Course   Clinical Course as of 10/06/21 1427  Thu Oct 06, 2021  1316 CXR is clear, CBC, BMP and Trop #1 normal. Continue to monitor pending Trop #2.  [CS]  8453 Patient reports some improvement in pain after ASA/NTG.  [CS]  6468 Repeat Trop remains neg. HEART Pathway score is 2, low risk for MACE. With negative workup patient does not require admission. Plan discharge with outpatient cardiology follow up for further evaluation and testing as indicated.  [CS]    Clinical Course User Index [CS] Truddie Hidden, MD     MDM Rules/Calculators/A&P Medical Decision Making Problems Addressed: Atypical chest pain: acute illness or injury that  poses a threat to life or bodily functions  Amount and/or Complexity of Data Reviewed Labs: ordered. Decision-making details documented in ED Course. Radiology: ordered and independent interpretation performed. Decision-making details documented in ED Course. ECG/medicine tests: ordered and independent interpretation performed. Decision-making details documented in ED Course.  Risk OTC drugs. Prescription drug management. Decision regarding hospitalization.    Final Clinical Impression(s) / ED Diagnoses Final diagnoses:  Atypical chest pain    Rx / DC Orders ED Discharge Orders     None        Truddie Hidden, MD 10/06/21 1427

## 2021-10-06 NOTE — ED Triage Notes (Signed)
Chest pain onset 1 hour ago

## 2021-10-21 ENCOUNTER — Encounter: Payer: Self-pay | Admitting: Internal Medicine

## 2021-10-21 ENCOUNTER — Ambulatory Visit: Payer: 59 | Admitting: Internal Medicine

## 2021-10-21 ENCOUNTER — Other Ambulatory Visit: Payer: Self-pay

## 2021-10-21 VITALS — BP 122/76 | HR 64 | Resp 20 | Ht 67.0 in | Wt 202.0 lb

## 2021-10-21 DIAGNOSIS — R072 Precordial pain: Secondary | ICD-10-CM

## 2021-10-21 MED ORDER — METOPROLOL TARTRATE 50 MG PO TABS: 50.0000 mg | ORAL_TABLET | Freq: Once | ORAL | 0 refills | Status: DC

## 2021-10-21 NOTE — Progress Notes (Signed)
Cardiology Office Note:    Date:  10/21/2021   ID:  Raymond Mccarthy, DOB [birth year], MRN [id number]  PCP:  Glenda Chroman, MD   [facility] Providers Cardiologist:  Janina Mayo, MD     Referring MD: Glenda Chroman, MD   No chief complaint on file. Angina  History of Present Illness:    Raymond Mccarthy is a 56 y.o. male with a hx of eosinophilic esophagitis s/p dilations x2 referral from the ER for chest pain  He went to the ED 10/06/2021. He started to have chest pain and felt nauseated. It was 7/10. He noted L sided chest pain. He received a nitroglycerin and it helped. He felt an elephant on his chest. He's had recurrent chest pains. His EKG had no ischemic changes. His troponin was negative. Further, he notes shortness of breath with stairs.  He denies PND, orthopnea or LE edema.   He reported these symptoms at [facility] urgent care in August, 2022. He had sharp chest pain that lasted 5 days. EKG did not show ischemic changes. He went to the [facility] ED. He was ruled out for ACS and sent home.  He coaches high school softball. He never smoked cigarettes  His paternal uncles passed in there late 75s due to heart disease. His mom has COPD and father with dementia.  No cardiac history    Past Medical History:  Diagnosis Date   Eosinophilic esophagitis     Past Surgical History:  Procedure Laterality Date   BIOPSY  01/01/2019   Procedure: BIOPSY;  Surgeon: Jerene Bears, MD;  Location: [facility] ENDOSCOPY;  Service: Gastroenterology;;   ESOPHAGOGASTRODUODENOSCOPY N/A 01/01/2019   Procedure: ESOPHAGOGASTRODUODENOSCOPY (EGD);  Surgeon: Jerene Bears, MD;  Location: [facility] ENDOSCOPY;  Service: Gastroenterology;  Laterality: N/A;   ESOPHAGOGASTRODUODENOSCOPY (EGD) WITH PROPOFOL N/A 07/30/2020   Procedure: ESOPHAGOGASTRODUODENOSCOPY (EGD) WITH PROPOFOL;  Surgeon: Irene Shipper, MD;  Location: [facility] ENDOSCOPY;  Service: Endoscopy;  Laterality: N/A;   FOREIGN BODY REMOVAL  01/01/2019    Procedure: FOREIGN BODY REMOVAL;  Surgeon: Jerene Bears, MD;  Location: [facility] ENDOSCOPY;  Service: Gastroenterology;;   FOREIGN BODY REMOVAL  07/30/2020   Procedure: FOREIGN BODY REMOVAL;  Surgeon: Irene Shipper, MD;  Location: [facility] ENDOSCOPY;  Service: Endoscopy;;   UPPER GASTROINTESTINAL ENDOSCOPY      Current Medications: Current Meds  Medication Sig   allopurinol (ZYLOPRIM) 300 MG tablet Take 300 mg by mouth daily as needed (gout).   metoprolol tartrate (LOPRESSOR) 50 MG tablet Take 1 tablet (50 mg total) by mouth once for 1 dose. PLEASE TAKE METOPROLOL 2  HOURS PRIOR TO CTA SCAN.   pantoprazole (PROTONIX) 40 MG tablet Take 40 mg twice daily for 1 month then once daily (Patient taking differently: Take 40 mg by mouth daily as needed (gerd).)     Allergies:   Bee venom and Penicillins   Social History   Socioeconomic History   Marital status: Single    Spouse name: Not on file   Number of children: Not on file   Years of education: Not on file   Highest education level: Not on file  Occupational History   Not on file  Tobacco Use   Smoking status: Never   Smokeless tobacco: Never  Vaping Use   Vaping Use: Never used  Substance and Sexual Activity   Alcohol use: Not Currently   Drug use: Not Currently   Sexual activity: Not on file  Other Topics Concern  Not on file  Social History Narrative   Not on file   Social Determinants of Health   Financial Resource Strain: Not on file  Food Insecurity: Not on file  Transportation Needs: Not on file  Physical Activity: Not on file  Stress: Not on file  Social Connections: Not on file     Family History: The patient's family history includes COPD in his mother; Dementia in his father. There is no history of Colon cancer, Esophageal cancer, Stomach cancer, Pancreatic cancer, Liver disease, Colon polyps, or Rectal cancer.  ROS:   Please see the history of present illness.     All other systems reviewed and are  negative.  EKGs/Labs/Other Studies Reviewed:    The following studies were reviewed today:   EKG:  EKG is  ordered today.  The ekg ordered today demonstrates   NSR, no ischemic changes  Recent Labs: 10/06/2021: BUN 11; Creatinine, Ser 1.10; Hemoglobin 15.6; Platelets 386; Potassium 3.6; Sodium 138  Recent Lipid Panel No results found for: CHOL, TRIG, HDL, CHOLHDL, VLDL, LDLCALC, LDLDIRECT   Risk Assessment/Calculations:           Physical Exam:    VS:  BP 122/76 (BP Location: Left Arm, Patient Position: Sitting, Cuff Size: Normal)    Pulse 64    Resp 20    Ht 5\' 7"  (1.702 m)    Wt 202 lb (91.6 kg)    SpO2 97%    BMI 31.64 kg/m     Wt Readings from Last 3 Encounters:  10/21/21 202 lb (91.6 kg)  04/19/21 208 lb (94.3 kg)  01/27/21 208 lb (94.3 kg)    GEN:  Well nourished, well developed in no acute distress HEENT: Normal NECK: No JVD; No carotid bruits LYMPHATICS: No lymphadenopathy CARDIAC: RRR, no murmurs, rubs, gallops RESPIRATORY:  Clear to auscultation without rales, wheezing or rhonchi  ABDOMEN: Soft, non-tender, non-distended MUSCULOSKELETAL:  No edema; No deformity  SKIN: Warm and dry NEUROLOGIC:  Alert and oriented x 3 PSYCHIATRIC:  Normal affect   ASSESSMENT:    #Angina: CCS grade II. He reports dyspnea with 4 METS and anginal symptoms. His risk includes premature CAD family hx. Don't have a recent lipid profile to help risk stratify further. Will plan for coronary CTA  PLAN:    In order of problems listed above:  Lipid profile Coronary CTA Follow up 3 months           Medication Adjustments/Labs and Tests Ordered: Current medicines are reviewed at length with the patient today.  Concerns regarding medicines are outlined above.  Orders Placed This Encounter  Procedures   CT CORONARY MORPH W/CTA COR W/SCORE W/CA W/CM &/OR WO/CM   Basic metabolic panel   Lipid panel   EKG 12-Lead   Meds ordered this encounter  Medications   metoprolol  tartrate (LOPRESSOR) 50 MG tablet    Sig: Take 1 tablet (50 mg total) by mouth once for 1 dose. PLEASE TAKE METOPROLOL 2  HOURS PRIOR TO CTA SCAN.    Dispense:  1 tablet    Refill:  0    Patient Instructions  Medication Instructions:  PLEASE TAKE 50mg  METOPROLOL TARTRATE 2 HOURS PRIOR TO CCTA SCAN  *If you need a refill on your cardiac medications before your next appointment, please call your pharmacy*  Lab Work: Please return for Blood Work NEXT WEEK. No appointment needed, lab here at the office is open Monday-Friday from 8AM to 4PM and closed daily for lunch from 12:45-1:45.  If you have labs (blood work) drawn today and your tests are completely normal, you will receive your results only by: [place] (if you have MyChart) OR A paper copy in the mail If you have any lab test that is abnormal or we need to change your treatment, we will call you to review the results.  Testing/Procedures: Your physician has requested that you have cardiac CT. Cardiac computed tomography (CT) is a painless test that uses an x-ray machine to take clear, detailed pictures of your heart. For further information please visit HugeFiesta[facility]tn. Please follow instruction sheet as given.  Follow-Up: At [facility], you and your health needs are our priority.  As part of our continuing mission to provide you with exceptional heart care, we have created designated Provider Care Teams.  These Care Teams include your primary Cardiologist (physician) and Advanced Practice Providers (APPs -  Physician Assistants and Nurse Practitioners) who all work together to provide you with the care you need, when you need it.  Your next appointment:   3 month(s)  The format for your next appointment:   In Person  Provider:   Janina Mayo, MD    Other Instructions   Your cardiac CT will be scheduled at one of the below locations:   [facility] [address] [place], [place]  [address] [phone]  If scheduled at [facility], please arrive at the [facility] main entrance (entrance A) of [facility] 30 minutes prior to test start time. You can use the FREE valet parking offered at the main entrance (encouraged to control the heart rate for the test) Proceed to the [facility] Radiology Department (first floor) to check-in and test prep.  Please follow these instructions carefully (unless otherwise directed):  Hold all erectile dysfunction medications at least 3 days (72 hrs) prior to test.  On the Night Before the Test: Be sure to Drink plenty of water. Do not consume any caffeinated/decaffeinated beverages or chocolate 12 hours prior to your test. Do not take any antihistamines 12 hours prior to your test.  On the Day of the Test: Drink plenty of water until 1 hour prior to the test. Do not eat any food 4 hours prior to the test. You may take your regular medications prior to the test.  Take metoprolol (Lopressor) 50mg  two hours prior to test.  After the Test: Drink plenty of water. After receiving IV contrast, you may experience a mild flushed feeling. This is normal. On occasion, you may experience a mild rash up to 24 hours after the test. This is not dangerous. If this occurs, you can take Benadryl 25 mg and increase your fluid intake. If you experience trouble breathing, this can be serious. If it is severe call 911 IMMEDIATELY. If it is mild, please call our office. If you take any of these medications: Glipizide/Metformin, Avandament, Glucavance, please do not take 48 hours after completing test unless otherwise instructed.  We will call to schedule your test 2-4 weeks out understanding that some insurance companies will need an authorization prior to the service being performed.   For non-scheduling related questions, please contact the cardiac imaging nurse navigator should you have any questions/concerns: [name], Cardiac  Imaging Nurse Navigator [name], Cardiac Imaging Nurse Navigator [facility] Direct Office Dial: [phone]   For scheduling needs, including cancellations and rescheduling, please call [place], [phone].     Signed, Janina Mayo, MD  10/21/2021 10:10 AM    [place]

## 2021-10-21 NOTE — Patient Instructions (Addendum)
Medication Instructions:  PLEASE TAKE 50mg  METOPROLOL TARTRATE 2 HOURS PRIOR TO CCTA SCAN  *If you need a refill on your cardiac medications before your next appointment, please call your pharmacy*  Lab Work: Please return for Blood Work NEXT WEEK. No appointment needed, lab here at the office is open Monday-Friday from 8AM to 4PM and closed daily for lunch from 12:45-1:45.   If you have labs (blood work) drawn today and your tests are completely normal, you will receive your results only by: Raymond Mccarthy (if you have MyChart) OR A paper copy in the mail If you have any lab test that is abnormal or we need to change your treatment, we will call you to review the results.  Testing/Procedures: Your physician has requested that you have cardiac CT. Cardiac computed tomography (CT) is a painless test that uses an x-ray machine to take clear, detailed pictures of your heart. For further information please visit [facility]uge[facility]iesta[facility]tn. Please follow instruction sheet as given.  Follow-Up: At [facility], you and your health needs are our priority.  As part of our continuing mission to provide you with exceptional heart care, we have created designated Provider Care Teams.  These Care Teams include your primary Cardiologist (physician) and Advanced Practice Providers (APPs -  Physician Assistants and Nurse Practitioners) who all work together to provide you with the care you need, when you need it.  Your next appointment:   3 month(s)  The format for your next appointment:   In Person  Provider:   Janina Mayo, MD    Other Instructions   Your cardiac CT will be scheduled at one of the below locations:   [facility] [address] [place], [place] [address] [phone]  If scheduled at [facility], please arrive at the [facility] main entrance (entrance A) of [facility] 30 minutes prior to test start time. You can use the FREE valet parking offered  at the main entrance (encouraged to control the heart rate for the test) Proceed to the [facility] Radiology Department (first floor) to check-in and test prep.  Please follow these instructions carefully (unless otherwise directed):  Hold all erectile dysfunction medications at least 3 days (72 hrs) prior to test.  On the Night Before the Test: Be sure to Drink plenty of water. Do not consume any caffeinated/decaffeinated beverages or chocolate 12 hours prior to your test. Do not take any antihistamines 12 hours prior to your test.  On the Day of the Test: Drink plenty of water until 1 hour prior to the test. Do not eat any food 4 hours prior to the test. You may take your regular medications prior to the test.  Take metoprolol (Lopressor) 50mg  two hours prior to test.  After the Test: Drink plenty of water. After receiving IV contrast, you may experience a mild flushed feeling. This is normal. On occasion, you may experience a mild rash up to 24 hours after the test. This is not dangerous. If this occurs, you can take Benadryl 25 mg and increase your fluid intake. If you experience trouble breathing, this can be serious. If it is severe call 911 IMMEDIATELY. If it is mild, please call our office. If you take any of these medications: Glipizide/Metformin, Avandament, Glucavance, please do not take 48 hours after completing test unless otherwise instructed.  We will call to schedule your test 2-4 weeks out understanding that some insurance companies will need an authorization prior to the service  being performed.   For non-scheduling related questions, please contact the cardiac imaging nurse navigator should you have any questions/concerns: [name], Cardiac Imaging Nurse Navigator [name], Cardiac Imaging Nurse Navigator [facility] Direct Office Dial: [phone]   For scheduling needs, including cancellations and rescheduling, please call  [place], [phone].

## 2021-10-24 LAB — BASIC METABOLIC PANEL
BUN/Creatinine Ratio: 10 (ref 9–20)
BUN: 10 mg/dL (ref 6–24)
CO2: 26 mmol/L (ref 20–29)
Calcium: 9.5 mg/dL (ref 8.7–10.2)
Chloride: 105 mmol/L (ref 96–106)
Creatinine, Ser: 1.01 mg/dL (ref 0.76–1.27)
Glucose: 97 mg/dL (ref 70–99)
Potassium: 4.2 mmol/L (ref 3.5–5.2)
Sodium: 144 mmol/L (ref 134–144)
eGFR: 89 mL/min/{1.73_m2} (ref >59)

## 2021-10-24 LAB — LIPID PANEL
Chol/HDL Ratio: 4.8 ratio (ref 0.0–5.0)
Cholesterol, Total: 158 mg/dL (ref 100–199)
HDL: 33 mg/dL — ABNORMAL LOW (ref >39)
LDL Chol Calc (NIH): 94 mg/dL (ref 0–99)
Triglycerides: 180 mg/dL — ABNORMAL HIGH (ref 0–149)
VLDL Cholesterol Cal: 31 mg/dL (ref 5–40)

## 2021-11-01 ENCOUNTER — Telehealth (HOSPITAL_COMMUNITY): Payer: Self-pay | Admitting: Emergency Medicine

## 2021-11-01 NOTE — Telephone Encounter (Signed)
Reaching out to patient to offer assistance regarding upcoming cardiac imaging study; pt verbalizes understanding of appt date/time, parking situation and where to check in, pre-test NPO status and medications ordered, and verified current allergies; name and call back number provided for further questions should they arise [name] RN [facility] [phone] office [phone] cell  Difficult IV 50mg  metoprolol tartrate  Arrival 1100

## 2021-11-02 ENCOUNTER — Other Ambulatory Visit: Payer: Self-pay

## 2021-11-02 ENCOUNTER — Ambulatory Visit (HOSPITAL_COMMUNITY)
Admission: RE | Admit: 2021-11-02 | Discharge: 2021-11-02 | Disposition: A | Discharge status: Home / Self Care | Payer: 59 | Source: Ambulatory Visit | Attending: Internal Medicine | Admitting: Internal Medicine

## 2021-11-02 ENCOUNTER — Ambulatory Visit: Payer: 59 | Admitting: Cardiovascular Disease

## 2021-11-02 DIAGNOSIS — R072 Precordial pain: Secondary | ICD-10-CM

## 2021-11-02 MED ORDER — NITROGLYCERIN 0.4 MG SL SUBL
SUBLINGUAL_TABLET | SUBLINGUAL | Status: AC
  Filled 2021-11-02: qty 2

## 2021-11-02 MED ORDER — NITROGLYCERIN 0.4 MG SL SUBL
0.8000 mg | SUBLINGUAL_TABLET | Freq: Once | SUBLINGUAL | Status: AC
  Administered 2021-11-02: 0.8 mg via SUBLINGUAL

## 2021-11-02 MED ORDER — IOHEXOL 350 MG/ML SOLN
100.0000 mL | Freq: Once | INTRAVENOUS | Status: AC | PRN
  Administered 2021-11-02: 100 mL via INTRAVENOUS

## 2022-01-19 NOTE — Progress Notes (Deleted)
Cardiology Office Note:    Date:  01/19/2022   ID:  Raymond Mccarthy, DOB October 15, 1965, MRN [id number]  PCP:  Glenda Chroman, MD   [facility] Providers Cardiologist:  Janina Mayo, MD     Referring MD: Glenda Chroman, MD   No chief complaint on file. Angina  History of Present Illness:    Raymond Mccarthy is a 56 y.o. male with a hx of eosinophilic esophagitis s/p dilations x2 referral from the ER for chest pain  He went to the ED 10/06/2021. He started to have chest pain and felt nauseated. It was 7/10. He noted L sided chest pain. He received a nitroglycerin and it helped. He felt an elephant on his chest. He's had recurrent chest pains. His EKG had no ischemic changes. His troponin was negative. Further, he notes shortness of breath with stairs.  He denies PND, orthopnea or LE edema.   He reported these symptoms at [facility] urgent care in August, 2022. He had sharp chest pain that lasted 5 days. EKG did not show ischemic changes. He went to the [facility] ED. He was ruled out for ACS and sent home.  He coaches high school softball. He never smoked cigarettes  His paternal uncles passed in there late 37s due to heart disease. His mom has COPD and father with dementia.  No cardiac history  Interim Hx 01/19/2022 His coronary CTA did not show any obstructive lesions. CAC was 0.   Past Medical History:  Diagnosis Date   Eosinophilic esophagitis     Past Surgical History:  Procedure Laterality Date   BIOPSY  01/01/2019   Procedure: BIOPSY;  Surgeon: Jerene Bears, MD;  Location: [facility] ENDOSCOPY;  Service: Gastroenterology;;   ESOPHAGOGASTRODUODENOSCOPY N/A 01/01/2019   Procedure: ESOPHAGOGASTRODUODENOSCOPY (EGD);  Surgeon: Jerene Bears, MD;  Location: [facility] ENDOSCOPY;  Service: Gastroenterology;  Laterality: N/A;   ESOPHAGOGASTRODUODENOSCOPY (EGD) WITH PROPOFOL N/A 07/30/2020   Procedure: ESOPHAGOGASTRODUODENOSCOPY (EGD) WITH PROPOFOL;  Surgeon: Irene Shipper, MD;  Location:  [facility] ENDOSCOPY;  Service: Endoscopy;  Laterality: N/A;   FOREIGN BODY REMOVAL  01/01/2019   Procedure: FOREIGN BODY REMOVAL;  Surgeon: Jerene Bears, MD;  Location: [facility] ENDOSCOPY;  Service: Gastroenterology;;   FOREIGN BODY REMOVAL  07/30/2020   Procedure: FOREIGN BODY REMOVAL;  Surgeon: Irene Shipper, MD;  Location: [facility] ENDOSCOPY;  Service: Endoscopy;;   UPPER GASTROINTESTINAL ENDOSCOPY      Current Medications: No outpatient medications have been marked as taking for the 01/20/22 encounter (Appointment) with Janina Mayo, MD.     Allergies:   Bee venom and Penicillins   Social History   Socioeconomic History   Marital status: Single    Spouse name: Not on file   Number of children: Not on file   Years of education: Not on file   Highest education level: Not on file  Occupational History   Not on file  Tobacco Use   Smoking status: Never   Smokeless tobacco: Never  Vaping Use   Vaping Use: Never used  Substance and Sexual Activity   Alcohol use: Not Currently   Drug use: Not Currently   Sexual activity: Not on file  Other Topics Concern   Not on file  Social History Narrative   Not on file   Social Determinants of Health   Financial Resource Strain: Not on file  Food Insecurity: Not on file  Transportation Needs: Not on file  Physical Activity: Not on file  Stress:  Not on file  Social Connections: Not on file     Family History: The patient's family history includes COPD in his mother; Dementia in his father. There is no history of Colon cancer, Esophageal cancer, Stomach cancer, Pancreatic cancer, Liver disease, Colon polyps, or Rectal cancer.  ROS:   Please see the history of present illness.     All other systems reviewed and are negative.  EKGs/Labs/Other Studies Reviewed:    The following studies were reviewed today:   EKG:  EKG is  ordered today.  The ekg ordered today demonstrates   NSR, no ischemic changes  Recent Labs: 10/06/2021: Hemoglobin 15.6;  Platelets 386 10/24/2021: BUN 10; Creatinine, Ser 1.01; Potassium 4.2; Sodium 144  Recent Lipid Panel    Component Value Date/Time   CHOL 158 10/24/2021 0900   TRIG 180 (H) 10/24/2021 0900   HDL 33 (L) 10/24/2021 0900   CHOLHDL 4.8 10/24/2021 0900   LDLCALC 94 10/24/2021 0900     Risk Assessment/Calculations:           Physical Exam:    VS:  There were no vitals taken for this visit.    Wt Readings from Last 3 Encounters:  10/21/21 202 lb (91.6 kg)  04/19/21 208 lb (94.3 kg)  01/27/21 208 lb (94.3 kg)    GEN:  Well nourished, well developed in no acute distress HEENT: Normal NECK: No JVD; No carotid bruits LYMPHATICS: No lymphadenopathy CARDIAC: RRR, no murmurs, rubs, gallops RESPIRATORY:  Clear to auscultation without rales, wheezing or rhonchi  ABDOMEN: Soft, non-tender, non-distended MUSCULOSKELETAL:  No edema; No deformity  SKIN: Warm and dry NEUROLOGIC:  Alert and oriented x 3 PSYCHIATRIC:  Normal affect   ASSESSMENT:    #Angina: CCS grade II. He reports dyspnea with 4 METS and anginal symptoms. His risk includes premature CAD family hx. Don't have a recent lipid profile to help risk stratify further. Will plan for coronary CTA  PLAN:    In order of problems listed above:  Lipid profile Coronary CTA Follow up 3 months           Medication Adjustments/Labs and Tests Ordered: Current medicines are reviewed at length with the patient today.  Concerns regarding medicines are outlined above.  No orders of the defined types were placed in this encounter.  No orders of the defined types were placed in this encounter.   There are no Patient Instructions on file for this visit.   Signed, Janina Mayo, MD  01/19/2022 1:21 PM    [facility]

## 2022-01-20 ENCOUNTER — Ambulatory Visit: Payer: 59 | Admitting: Internal Medicine

## 2022-01-24 ENCOUNTER — Encounter: Payer: Self-pay | Admitting: Internal Medicine

## 2022-04-07 ENCOUNTER — Encounter: Payer: Self-pay | Admitting: Internal Medicine

## 2022-06-27 ENCOUNTER — Encounter (HOSPITAL_COMMUNITY)
Admission: EM | Disposition: A | Discharge status: Home / Self Care | Payer: Self-pay | Source: Home / Self Care | Attending: Emergency Medicine

## 2022-06-27 ENCOUNTER — Emergency Department (HOSPITAL_COMMUNITY): Payer: 59 | Admitting: Anesthesiology

## 2022-06-27 ENCOUNTER — Ambulatory Visit (HOSPITAL_COMMUNITY)
Admission: EM | Admit: 2022-06-27 | Discharge: 2022-06-27 | Disposition: A | Discharge status: Home / Self Care | Payer: 59 | Attending: Emergency Medicine | Admitting: Emergency Medicine

## 2022-06-27 ENCOUNTER — Other Ambulatory Visit: Payer: Self-pay

## 2022-06-27 ENCOUNTER — Ambulatory Visit (HOSPITAL_COMMUNITY): Admit: 2022-06-27 | Payer: 59 | Admitting: Internal Medicine

## 2022-06-27 ENCOUNTER — Encounter (HOSPITAL_COMMUNITY): Payer: Self-pay

## 2022-06-27 DIAGNOSIS — Z91148 Patient's other noncompliance with medication regimen for other reason: Secondary | ICD-10-CM | POA: Diagnosis not present

## 2022-06-27 DIAGNOSIS — K219 Gastro-esophageal reflux disease without esophagitis: Secondary | ICD-10-CM | POA: Diagnosis not present

## 2022-06-27 DIAGNOSIS — R1314 Dysphagia, pharyngoesophageal phase: Secondary | ICD-10-CM | POA: Diagnosis present

## 2022-06-27 DIAGNOSIS — W44F3XA Food entering into or through a natural orifice, initial encounter: Secondary | ICD-10-CM | POA: Insufficient documentation

## 2022-06-27 DIAGNOSIS — Z79899 Other long term (current) drug therapy: Secondary | ICD-10-CM | POA: Diagnosis not present

## 2022-06-27 DIAGNOSIS — Z8719 Personal history of other diseases of the digestive system: Secondary | ICD-10-CM | POA: Insufficient documentation

## 2022-06-27 DIAGNOSIS — T18128A Food in esophagus causing other injury, initial encounter: Secondary | ICD-10-CM | POA: Diagnosis not present

## 2022-06-27 HISTORY — PX: ESOPHAGOGASTRODUODENOSCOPY (EGD) WITH PROPOFOL: SHX5813

## 2022-06-27 SURGERY — ESOPHAGOGASTRODUODENOSCOPY (EGD) WITH PROPOFOL
Anesthesia: General

## 2022-06-27 MED ORDER — PROPOFOL 10 MG/ML IV BOLUS
INTRAVENOUS | Status: AC
  Filled 2022-06-27: qty 20

## 2022-06-27 MED ORDER — GLUCAGON HCL RDNA (DIAGNOSTIC) 1 MG IJ SOLR
1.0000 mg | Freq: Once | INTRAMUSCULAR | Status: DC
  Filled 2022-06-27: qty 1

## 2022-06-27 MED ORDER — GLUCAGON HCL RDNA (DIAGNOSTIC) 1 MG IJ SOLR
1.0000 mg | Freq: Once | INTRAMUSCULAR | Status: AC
  Administered 2022-06-27: 1 mg via INTRAVENOUS

## 2022-06-27 MED ORDER — PROPOFOL 10 MG/ML IV BOLUS
INTRAVENOUS | Status: DC | PRN
  Administered 2022-06-27: 100 mg via INTRAVENOUS

## 2022-06-27 MED ORDER — ONDANSETRON HCL 4 MG/2ML IJ SOLN
4.0000 mg | Freq: Once | INTRAMUSCULAR | Status: AC
  Administered 2022-06-27: 4 mg via INTRAVENOUS
  Filled 2022-06-27: qty 2

## 2022-06-27 MED ORDER — LACTATED RINGERS IV SOLN: INTRAVENOUS | Status: DC | PRN

## 2022-06-27 MED ORDER — ONDANSETRON 4 MG PO TBDP
4.0000 mg | ORAL_TABLET | Freq: Once | ORAL | Status: DC
  Filled 2022-06-27: qty 1

## 2022-06-27 MED ORDER — LACTATED RINGERS IV SOLN: INTRAVENOUS | Status: DC

## 2022-06-27 NOTE — Consult Note (Signed)
$'@LOGO'y$ @   Primary Care Physician:  Glenda Chroman, MD Primary Gastroenterologist:  Dr. Gala Romney  Pre-Procedure History & Physical: HPI:  Raymond Mccarthy is a 56 y.o. male with a history of GERD complicated by peptic stricture and recurrent food impaction presented acutely to our ED this evening after swallowing a piece of chicken earlier today and felt it lodged behind his breastbone.  He has been unable to swallow anything including saliva.  ED evaluation thus far is consistent with a food impaction.  He has been given IV fluids and glucagon without improvement.,  Lavonna Rua, PA in the ED called me for assistance.    Patient has a history of multiple endoscopic interventions with [facility].  Food impaction in 2021 and 2020;  last year he underwent multiple TTS balloon dilations 16 and 17 mm, respectively in 2 sessions.  Endoscopically, there was concern for eosinophilic esophagitis previously.  However, biopsies revealed no more than 10 eosinophils per high-powered field.  Findings most consistent with reflux esophagitis. He Previously prescribed Protonix 40 mg once daily.  Patient tells me that he does not perceive he has any reflux symptoms and has not been taking his medication.   Past Medical History:  Diagnosis Date   Eosinophilic esophagitis     Past Surgical History:  Procedure Laterality Date   BIOPSY  01/01/2019   Procedure: BIOPSY;  Surgeon: Jerene Bears, MD;  Location: [facility] ENDOSCOPY;  Service: Gastroenterology;;   ESOPHAGOGASTRODUODENOSCOPY N/A 01/01/2019   Procedure: ESOPHAGOGASTRODUODENOSCOPY (EGD);  Surgeon: Jerene Bears, MD;  Location: [facility] ENDOSCOPY;  Service: Gastroenterology;  Laterality: N/A;   ESOPHAGOGASTRODUODENOSCOPY (EGD) WITH PROPOFOL N/A 07/30/2020   Procedure: ESOPHAGOGASTRODUODENOSCOPY (EGD) WITH PROPOFOL;  Surgeon: Irene Shipper, MD;  Location: [facility] ENDOSCOPY;  Service: Endoscopy;  Laterality: N/A;   FOREIGN BODY REMOVAL  01/01/2019   Procedure: FOREIGN BODY  REMOVAL;  Surgeon: Jerene Bears, MD;  Location: [facility] ENDOSCOPY;  Service: Gastroenterology;;   FOREIGN BODY REMOVAL  07/30/2020   Procedure: FOREIGN BODY REMOVAL;  Surgeon: Irene Shipper, MD;  Location: [facility] ENDOSCOPY;  Service: Endoscopy;;   UPPER GASTROINTESTINAL ENDOSCOPY      Prior to Admission medications   Medication Sig Start Date End Date Taking? Authorizing Provider  allopurinol (ZYLOPRIM) 300 MG tablet Take 300 mg by mouth daily as needed (gout). 02/13/20   [provider]  metoprolol tartrate (LOPRESSOR) 50 MG tablet Take 1 tablet (50 mg total) by mouth once for 1 dose. PLEASE TAKE METOPROLOL 2  HOURS PRIOR TO CTA SCAN. 10/21/21 10/21/21  Janina Mayo, MD  pantoprazole (PROTONIX) 40 MG tablet Take 40 mg twice daily for 1 month then once daily Patient taking differently: Take 40 mg by mouth daily as needed (gerd). 12/02/20   Pyrtle, Lajuan Lines, MD    Allergies as of 06/27/2022 - Review Complete 06/27/2022  Allergen Reaction Noted   Bee venom Hives 04/19/2021   Penicillins Hives 04/19/2021    Family History  Problem Relation Age of Onset   COPD Mother    Dementia Father    Colon cancer Neg Hx    Esophageal cancer Neg Hx    Stomach cancer Neg Hx    Pancreatic cancer Neg Hx    Liver disease Neg Hx    Colon polyps Neg Hx    Rectal cancer Neg Hx     Social History   Socioeconomic History   Marital status: Single    Spouse name: Not on file   Number of children:  Not on file   Years of education: Not on file   Highest education level: Not on file  Occupational History   Not on file  Tobacco Use   Smoking status: Never   Smokeless tobacco: Never  Vaping Use   Vaping Use: Never used  Substance and Sexual Activity   Alcohol use: Not Currently   Drug use: Not Currently   Sexual activity: Not on file  Other Topics Concern   Not on file  Social History Narrative   Not on file   Social Determinants of Health   Financial Resource Strain: Not on file  Food  Insecurity: Not on file  Transportation Needs: Not on file  Physical Activity: Not on file  Stress: Not on file  Social Connections: Not on file  Intimate Partner Violence: Not on file    Review of Systems: See HPI, otherwise negative ROS  Physical Exam: BP (!) 146/83 (BP Location: Right Arm)   Pulse 76   Temp 98.2 F (36.8 C) (Oral)   Resp 18   Ht '5\' 7"'$  (1.702 m)   Wt 93 kg   SpO2 99%   BMI 32.11 kg/m  General:   Alert,  Well-developed, well-nourished, pleasant and cooperative in NAD Neck:  Supple; no masses or thyromegaly. No significant cervical adenopathy. Lungs:  Clear throughout to auscultation.   No wheezes, crackles, or rhonchi. No acute distress. Heart:  Regular rate and rhythm; no murmurs, clicks, rubs,  or gallops. Abdomen: Non-distended, normal bowel sounds.  Soft and nontender without appreciable mass or hepatosplenomegaly.  Pulses:  Normal pulses noted. Extremities:  Without clubbing or edema.  Impression/Plan: Pleasant 56 year old gentleman with longstanding GERD complicated by peptic stricture/recurrent esophageal dysphagia and multiple food impactions. He has had several "close calls" with swallowing meat over the past few months.  Noncompliant with his acid suppression regimen.  Patient has presented this evening with compelling scenario for recurrent esophageal food impaction.  I have offered the patient an emergency EGD for food impaction removal.  I explained to the patient our goal this evening is to remove the food impaction.  Esophageal dilation would likely not be performed this session due to food debris in the upper GI tract and associated inflammation.  In all likelihood, it will be best practice to place him on a full liquid diet afterwards and have him follow-up with Dr. Hilarie Fredrickson.  The risks, benefits, limitations, alternatives and imponderables have been reviewed with the patient. Potential for esophageal dilation, biopsy, etc. have also been  reviewed.  Questions have been answered. Patient is agreeable.   Further recommendations to follow.        Notice: This dictation was prepared with Dragon dictation along with smaller phrase technology. Any transcriptional errors that result from this process are unintentional and may not be corrected upon review.

## 2022-06-27 NOTE — Transfer of Care (Signed)
Immediate Anesthesia Transfer of Care Note  Patient: Raymond Mccarthy  Procedure(s) Performed: ESOPHAGOGASTRODUODENOSCOPY (EGD) WITH PROPOFOL  Patient Location: PACU  Anesthesia Type:General  Level of Consciousness: awake  Airway & Oxygen Therapy: Patient Spontanous Breathing  Post-op Assessment: Report given to RN and Post -op Vital signs reviewed and stable  Post vital signs: Reviewed and stable  Last Vitals:  Vitals Value Taken Time  BP 119/64 06/27/22 1922  Temp 97.8   Pulse 67 06/27/22 1924  Resp 16 06/27/22 1924  SpO2 96 % 06/27/22 1924  Vitals shown include unvalidated device data.  Last Pain:  Vitals:   06/27/22 1906  TempSrc: Oral  PainSc: 0-No pain      Patients Stated Pain Goal: 7 (13/68/59 9234)  Complications: No notable events documented.

## 2022-06-27 NOTE — ED Notes (Signed)
Patient vomiting up water at this time.

## 2022-06-27 NOTE — Anesthesia Preprocedure Evaluation (Signed)
Anesthesia Evaluation  Patient identified by MRN, date of birth, ID band Patient awake    Reviewed: Allergy & Precautions, H&P , NPO status , Patient's Chart, lab work & pertinent test results, reviewed documented beta blocker date and time   Airway Mallampati: II  TM Distance: >3 FB Neck ROM: full    Dental no notable dental hx.    Pulmonary neg pulmonary ROS,    Pulmonary exam normal breath sounds clear to auscultation       Cardiovascular Exercise Tolerance: Good negative cardio ROS   Rhythm:regular Rate:Normal     Neuro/Psych negative neurological ROS  negative psych ROS   GI/Hepatic negative GI ROS, Neg liver ROS,   Endo/Other  negative endocrine ROS  Renal/GU negative Renal ROS  negative genitourinary   Musculoskeletal   Abdominal   Peds  Hematology negative hematology ROS (+)   Anesthesia Other Findings   Reproductive/Obstetrics negative OB ROS                             Anesthesia Physical Anesthesia Plan  ASA: 2  Anesthesia Plan: General   Post-op Pain Management:    Induction:   PONV Risk Score and Plan: Propofol infusion  Airway Management Planned:   Additional Equipment:   Intra-op Plan:   Post-operative Plan:   Informed Consent: I have reviewed the patients History and Physical, chart, labs and discussed the procedure including the risks, benefits and alternatives for the proposed anesthesia with the patient or authorized representative who has indicated his/her understanding and acceptance.     Dental Advisory Given  Plan Discussed with: CRNA  Anesthesia Plan Comments:         Anesthesia Quick Evaluation

## 2022-06-27 NOTE — ED Triage Notes (Signed)
Pt reports getting chicken stuck in his throat yesterday . Pt reports that he has had his throat stretched before. Pt reports that nothing will do down.

## 2022-06-27 NOTE — Discharge Instructions (Signed)
EGD Discharge instructions Please read the instructions outlined below and refer to this sheet in the next few weeks. These discharge instructions provide you with general information on caring for yourself after you leave the hospital. Your doctor may also give you specific instructions. While your treatment has been planned according to the most current medical practices available, unavoidable complications occasionally occur. If you have any problems or questions after discharge, please call your doctor. ACTIVITY You may resume your regular activity but move at a slower pace for the next 24 hours.  Take frequent rest periods for the next 24 hours.  Walking will help expel (get rid of) the air and reduce the bloated feeling in your abdomen.  No driving for 24 hours (because of the anesthesia (medicine) used during the test).  You may shower.  Do not sign any important legal documents or operate any machinery for 24 hours (because of the anesthesia used during the test).  NUTRITION Drink plenty of fluids.  You may resume your normal diet.  Begin with a light meal and progress to your normal diet.  Avoid alcoholic beverages for 24 hours or as instructed by your caregiver.  MEDICATIONS You may resume your normal medications unless your caregiver tells you otherwise.  WHAT YOU CAN EXPECT TODAY You may experience abdominal discomfort such as a feeling of fullness or "gas" pains.  FOLLOW-UP Your doctor will discuss the results of your test with you.  SEEK IMMEDIATE MEDICAL ATTENTION IF ANY OF THE FOLLOWING OCCUR: Excessive nausea (feeling sick to your stomach) and/or vomiting.  Severe abdominal pain and distention (swelling).  Trouble swallowing.  Temperature over 101 F (37.8 C).  Rectal bleeding or vomiting of blood.    Food bolus removed from your esophagus this evening.  Begin Protonix 40 mg twice daily new prescription provided - Best to be taken 30 minutes before breakfast and  supper  Full liquid diet next 24 hours and then advance diet slowly.  Avoid pieces of meat and bread.  Chopped meats only.  Take extra time to eat, chew food thoroughly and have liquids on hand to assist with swallowing  Keep your appointment on November 17 with Dr. Hilarie Fredrickson

## 2022-06-27 NOTE — ED Provider Notes (Signed)
[facility] Provider Note   CSN: [id number] Arrival date & time: 06/27/22  1652     History  No chief complaint on file.   Kroy Sprung is a 56 y.o. male.  Patient with history of gastritis and esophageal stricture requiring esophageal stretching presents today with foreign body stuck in throat. He states that last night he was eating chicken and felt a bite of chicken get stuck in his throat. He states that since then everything that he has swallowed has gotten stuck and he has subsequently vomited back up.  He states that this has happened previously and has required endoscopy to remove.  He also states that he has had to have his esophagus stretched previously.  States that his symptoms feel similar to when this happened previously.  States that there was no bones in the chicken that he ate yesterday.  Denies any shortness of breath or chest pain.  The history is provided by the patient. No language interpreter was used.       Home Medications Prior to Admission medications   Medication Sig Start Date End Date Taking? Authorizing Provider  allopurinol (ZYLOPRIM) 300 MG tablet Take 300 mg by mouth daily as needed (gout). 02/13/20   [provider]  metoprolol tartrate (LOPRESSOR) 50 MG tablet Take 1 tablet (50 mg total) by mouth once for 1 dose. PLEASE TAKE METOPROLOL 2  HOURS PRIOR TO CTA SCAN. 10/21/21 10/21/21  Janina Mayo, MD  pantoprazole (PROTONIX) 40 MG tablet Take 40 mg twice daily for 1 month then once daily Patient taking differently: Take 40 mg by mouth daily as needed (gerd). 12/02/20   Pyrtle, Lajuan Lines, MD      Allergies    Bee venom and Penicillins    Review of Systems   Review of Systems  All other systems reviewed and are negative.   Physical Exam Updated Vital Signs BP (!) 146/83 (BP Location: Right Arm)   Pulse 76   Temp 98.2 F (36.8 C) (Oral)   Resp 18   Ht '5\' 7"'$  (1.702 m)   Wt 93 kg   SpO2 99%   BMI 32.11 kg/m   Physical Exam Vitals and nursing note reviewed.  Constitutional:      General: He is not in acute distress.    Appearance: Normal appearance. He is normal weight. He is not ill-appearing, toxic-appearing or diaphoretic.  HENT:     Head: Normocephalic and atraumatic.  Cardiovascular:     Rate and Rhythm: Normal rate.  Pulmonary:     Effort: Pulmonary effort is normal. No respiratory distress.     Breath sounds: Normal breath sounds. No stridor.  Abdominal:     General: Abdomen is flat.     Palpations: Abdomen is soft.  Musculoskeletal:        General: Normal range of motion.     Cervical back: Normal range of motion.  Skin:    General: Skin is warm and dry.  Neurological:     General: No focal deficit present.     Mental Status: He is alert.  Psychiatric:        Mood and Affect: Mood normal.        Behavior: Behavior normal.     ED Results / Procedures / Treatments   Labs (all labs ordered are listed, but only abnormal results are displayed) Labs Reviewed - No data to display  EKG None  Radiology No results found.  Procedures Procedures  Medications Ordered in ED Medications  lactated ringers infusion ( Intravenous New Bag/Given 06/27/22 1740)  glucagon (human recombinant) (GLUCAGEN) injection 1 mg (1 mg Intravenous Given 06/27/22 1737)  ondansetron (ZOFRAN) injection 4 mg (4 mg Intravenous Given 06/27/22 1735)    ED Course/ Medical Decision Making/ A&P                           Medical Decision Making Risk Prescription drug management.   Patient presents today with food bolus in his throat since eating chicken yesterday. He is afebrile, nontoxic-appearing, and in no acute distress with reassuring vital signs.  Patient endorses history of similar symptoms previously requiring endoscopy and esophageal stretching.  He is currently tolerating secretions. He also denies any shortness of breath.  Patient given glucagon and Zofran without clearance of food  bolus.  Patient also failed fluid challenge.  Discussed patient with GI on-call Dr. Gala Romney, plan for endoscopy at 7 pm. Patient is understanding and amenable with plan.    Final Clinical Impression(s) / ED Diagnoses Final diagnoses:  Food impaction of esophagus, initial encounter    Rx / DC Orders ED Discharge Orders     None         Nestor Lewandowsky 06/27/22 Kriste Basque, MD 06/28/22 [phone]

## 2022-06-28 NOTE — Op Note (Signed)
[facility] Patient Name: Raymond Mccarthy Procedure Date: 06/27/2022 6:39 PM MRN: [id number] Date of Birth: 07-Jun-1966 Attending MD: Norvel Richards , MD CSN: [id number] Age: 56 Admit Type: Outpatient Procedure:                Upper GI endoscopy Indications:              Esophageal food impaction; recurrent esophageal                            dysphagia. Patient not taking any acid suppression                            therapy. See my H&P. Providers:                Norvel Richards, MD, Lurline Del, RN, Casimer Bilis, Technician, Ladoris Gene                            Technician, Technician Referring MD:              Medicines:                Propofol per Anesthesia Complications:            No immediate complications. Estimated Blood Loss:     Estimated blood loss: none. Procedure:                Pre-Anesthesia Assessment:                           - Prior to the procedure, a History and Physical                            was performed, and patient medications and                            allergies were reviewed. The patient's tolerance of                            previous anesthesia was also reviewed. The risks                            and benefits of the procedure and the sedation                            options and risks were discussed with the patient.                            All questions were answered, and informed consent                            was obtained. Prior Anticoagulants: The patient has                            taken  no previous anticoagulant or antiplatelet                            agents. ASA Grade Assessment: II - A patient with                            mild systemic disease. After reviewing the risks                            and benefits, the patient was deemed in                            satisfactory condition to undergo the procedure.                           After obtaining informed  consent, the endoscope was                            passed under direct vision. Throughout the                            procedure, the patient's blood pressure, pulse, and                            oxygen saturations were monitored continuously. The                            [id number]-[id number] ([id number]) scope was introduced through the                            mouth, and advanced to the body of the stomach. The                            upper GI endoscopy was accomplished without                            difficulty. The patient tolerated the procedure                            well. Scope In: 7:16:20 PM Scope Out: 7:18:02 PM Total Procedure Duration: 0 hours 1 minute 42 seconds  Findings:      As I intubated the esophagus and insufflated, I found a food bolus ball       valving in the distal esophagus which dropped spontaneously into the       stomach. Distal esophageal mucosa was macerated there were geographic       erosions within 2 cm of the EG junction circumferentially appeared to be       a soft peptic stricture. No tumor identified. The tubular esophagus       otherwise, appeared normal. I saw no endoscopic evidence of EOE. EG       junction easily traversed. Food debris in the body of the stomach. Exam       limited to this area /brief retroflexion revealed no evidence of tumor.  At this point, the procedure was terminated. No dilation or biopsy       performed. Impression:               Esophageal food impaction?"disimpacted as described                            above. Soft peptic stricture with associated distal                            esophageal erosions consistent with erosive reflux                            esophagitis. Moderate Sedation:      Moderate (conscious) sedation was personally administered by an       anesthesia professional. The following parameters were monitored: oxygen       saturation, heart rate, blood pressure, respiratory rate, EKG,  adequacy       of pulmonary ventilation, and response to care. Recommendation:           - Patient has a contact number available for                            emergencies. The signs and symptoms of potential                            delayed complications were discussed with the                            patient. Return to normal activities tomorrow.                            Written discharge instructions were provided to the                            patient.                           - Full liquid diet. For now, twice daily PPI in the                            way of Protonix 40 mg twice daily to be taken 30                            minutes before breakfast and supper. Compliance                            reviewed with patient and spouse. With dentures, it                            is particularly important to take time to eat and                            chew thoroughly. I understand patient has an  appointment see Dr. Hilarie Fredrickson on the 17th of next                            month. Procedure Code(s):        --- Professional ---                           [phone], 52, Esophagogastroduodenoscopy, flexible,                            transoral; diagnostic, including collection of                            specimen(s) by brushing or washing, when performed                            (separate procedure) Diagnosis Code(s):        --- Professional ---                           T18.108A, Unspecified foreign body in esophagus                            causing other injury, initial encounter CPT copyright 2019 [employer]. All rights reserved. The codes documented in this report are preliminary and upon coder review may  be revised to meet current compliance requirements. [name]. [name], MD Norvel Richards, MD 06/27/2022 7:40:50 PM This report has been signed electronically. Number of Addenda: 0

## 2022-06-29 NOTE — Anesthesia Postprocedure Evaluation (Signed)
Anesthesia Post Note  Patient: Raymond Mccarthy  Procedure(s) Performed: ESOPHAGOGASTRODUODENOSCOPY (EGD) WITH PROPOFOL  Patient location during evaluation: Phase II Anesthesia Type: General Level of consciousness: awake Pain management: pain level controlled Vital Signs Assessment: post-procedure vital signs reviewed and stable Respiratory status: spontaneous breathing and respiratory function stable Cardiovascular status: blood pressure returned to baseline and stable Postop Assessment: no headache and no apparent nausea or vomiting Anesthetic complications: no Comments: Late entry   No notable events documented.   Last Vitals:  Vitals:   06/27/22 1941 06/27/22 1945  BP: 134/70 (!) 106/94  Pulse: 67 67  Resp: 16 17  Temp:  36.9 C  SpO2: 98% 98%    Last Pain:  Vitals:   06/27/22 1945  TempSrc: Oral  PainSc: 0-No pain                 Louann Sjogren

## 2022-06-30 ENCOUNTER — Encounter (HOSPITAL_COMMUNITY): Payer: Self-pay | Admitting: Internal Medicine

## 2022-07-27 NOTE — Progress Notes (Signed)
07/27/2022 Raymond Mccarthy [id number] 02-09-1966   Chief Complaint: Difficulty swallowing   History of Present Illness: Raymond Mccarthy is a 56 year old male with a past medical history of a nonbleeding duodenal ulcer and reflux esophagitis/peptic stricture with recurrent food bolus impactions.  He is known by Dr. Hilarie Fredrickson.  He was eating boneless chicken which became stuck in his mid esophagus and he had difficulty swallowing his saliva.  He presented to [facility] ED 06/27/2022.  He underwent an EGD by Dr. Gala Romney which identified a food bolus ball in the distal esophagus which dropped spontaneously into the stomach, a soft peptic stricture with associated distal esophageal erosions consistent with erosive reflux esophagitis.  His esophagus was not dilated at that time due to food debris in the upper GI tract and associated significant esophageal inflammation.  He informed Dr. Gala Romney that he had not been taking Protonix as he did not perceive having any reflux symptoms.  Pantoprazole was prescribed 40 mg twice daily and he was advised to follow-up with Dr. Hilarie Fredrickson to have a repeat EGD in a few months to assess for esophageal healing and to dilate his esophagus.  Since he underwent an EGD at [facility], he had 2 brief episodes of dysphagia which resulted in gagging and expelling out the stuck food.  His most recent episode occurred 2 days ago after eating a hamburger.  He has full upper dentures and a lower partial plate and acknowledges he may not always chew his food as thoroughly as he needs to.  No NSAID use.  He is taking Pantoprazole 40 mg twice daily as prescribed by Dr. Gala Romney. He is passing normal formed brown bowel movement daily.  Non-smoker.  He drinks 1 vodka martini every 3 weeks or less.    PAST GI PROCEDURES:  EGD by Dr. Gala Romney at [facility] 06/27/2022: Esophageal food impaction?disimpacted as described above. Soft peptic stricture with associated distal  esophageal erosions consistent with erosive reflux esophagitis.  EGD 01/27/2021: Mildly reflux esophagitis with no bleeding. - Benign-appearing esophageal stenosis at GE junction. Dilated to 17 mm with balloon. - Normal stomach. - Mild duodenitis. Previously seen duodenal ulcer has healed. - Normal second portion of the duodenum. - No specimens collected.  EGD 12/02/2020: - Esophageal mucosal changes consistent with eosinophilic esophagitis. Biopsied. Dilated to 14.5 mm with balloon. - Normal stomach. Biopsied. - Non-bleeding duodenal ulcer with no stigmata of bleeding. - Normal second portion of the duodenum. 1. Surgical [P], gastric anturm and gastric body - MILD CHRONIC GASTRITIS. - WARTHIN-STARRY STAIN IS NEGATIVE FOR HELICOBACTER PYLORI. 2. Surgical [P], mid and distal esophagus - CHRONIC ESOPHAGITIS WITH PATCHY MILDLY INCREASED INTRAEPITHELIAL EOSINOPHILS (FOCALLY UP TO 10/HPF) WITHOUT SIGNIFICANT DEGRANULATION, MOST CONSISTENT WITH REFLUX ESOPHAGITIS.  EGD 07/30/2020: 1. Large meat bolus in the distal esophagus removed endoscopically. 2. Esophageal stricture with underlying macerated mucosa 3. Duodenitis  EGD 02/12/2019: - Esophageal mucosal changes consistent with eosinophilic esophagitis. Biopsied. Dilated. - Normal stomach. Biopsied. - Duodenitis. - Normal second portion of the duodenum.  Colonoscopy 02/12/2019: - One 5 mm polyp in the transverse colon, removed with a cold snare. Resected and retrieved. - Diverticulosis in the sigmoid colon, in the descending colon and in the ascending colon. - The examination was otherwise normal on direct and retroflexion views. - 7 year recall colonoscopy   1. Surgical [P], gastric antrum and gastric body - MILD CHRONIC GASTRITIS WITHOUT ACTIVITY - NO H. PYLORI OR INTESTINAL METAPLASIA IDENTIFIED -  SEE COMMENT 2. Surgical [P], proximal and distal esophagus - REACTIVE SQUAMOUS MUCOSA WITH INCREASED INTRAEPITHELIAL LYMPHOCYTES AND  UP TO 10 EOSINOPHILS/HIGH POWER FIELD - SEE COMMENT 3. Surgical [P], colon, transverse, polyp - TUBULAR ADENOMA (1 OF 1 FRAGMENTS) - NO HIGH GRADE DYSPLASIA OR MALIGNANCY IDENTIFIED     Latest Ref Rng & Units 10/06/2021   11:38 AM 04/19/2021   11:16 AM  CBC  WBC 4.0 - 10.5 K/uL 7.4  5.8   Hemoglobin 13.0 - 17.0 g/dL 15.6  15.7   Hematocrit 39.0 - 52.0 % 44.8  46.0   Platelets 150 - 400 K/uL 386  288        Latest Ref Rng & Units 10/24/2021    9:00 AM 10/06/2021   11:38 AM 04/19/2021   11:16 AM  CMP  Glucose 70 - 99 mg/dL 97  81  96   BUN 6 - 24 mg/dL '10  11  11   '$ Creatinine 0.76 - 1.27 mg/dL 1.01  1.10  0.97   Sodium 134 - 144 mmol/L 144  138  141   Potassium 3.5 - 5.2 mmol/L 4.2  3.6  3.7   Chloride 96 - 106 mmol/L 105  101  103   CO2 20 - 29 mmol/L '26  26  30   '$ Calcium 8.7 - 10.2 mg/dL 9.5  9.3  9.4      Past Medical History:  Diagnosis Date   Eosinophilic esophagitis    Past Surgical History:  Procedure Laterality Date   BIOPSY  01/01/2019   Procedure: BIOPSY;  Surgeon: Jerene Bears, MD;  Location: [facility] ENDOSCOPY;  Service: Gastroenterology;;   ESOPHAGOGASTRODUODENOSCOPY N/A 01/01/2019   Procedure: ESOPHAGOGASTRODUODENOSCOPY (EGD);  Surgeon: Jerene Bears, MD;  Location: [facility] ENDOSCOPY;  Service: Gastroenterology;  Laterality: N/A;   ESOPHAGOGASTRODUODENOSCOPY (EGD) WITH PROPOFOL N/A 07/30/2020   Procedure: ESOPHAGOGASTRODUODENOSCOPY (EGD) WITH PROPOFOL;  Surgeon: Irene Shipper, MD;  Location: [facility] ENDOSCOPY;  Service: Endoscopy;  Laterality: N/A;   ESOPHAGOGASTRODUODENOSCOPY (EGD) WITH PROPOFOL N/A 06/27/2022   Procedure: ESOPHAGOGASTRODUODENOSCOPY (EGD) WITH PROPOFOL;  Surgeon: Daneil Dolin, MD;  Location: [facility] ENDO SUITE;  Service: Endoscopy;  Laterality: N/A;   FOREIGN BODY REMOVAL  01/01/2019   Procedure: FOREIGN BODY REMOVAL;  Surgeon: Jerene Bears, MD;  Location: [facility] ENDOSCOPY;  Service: Gastroenterology;;   FOREIGN BODY REMOVAL  07/30/2020   Procedure: FOREIGN BODY  REMOVAL;  Surgeon: Irene Shipper, MD;  Location: [facility] ENDOSCOPY;  Service: Endoscopy;;   UPPER GASTROINTESTINAL ENDOSCOPY     Current Outpatient Medications on File Prior to Visit  Medication Sig Dispense Refill   allopurinol (ZYLOPRIM) 300 MG tablet Take 300 mg by mouth daily as needed (gout).     pantoprazole (PROTONIX) 40 MG tablet Take 40 mg by mouth daily.     metoprolol tartrate (LOPRESSOR) 50 MG tablet Take 1 tablet (50 mg total) by mouth once for 1 dose. PLEASE TAKE METOPROLOL 2  HOURS PRIOR TO CTA SCAN. 1 tablet 0   No current facility-administered medications on file prior to visit.   Allergies  Allergen Reactions   Bee Venom Hives   Penicillins Hives   Current Medications, Allergies, Past Medical History, Past Surgical History, Family History and Social History were reviewed in [employer] record.  Review of Systems:   Constitutional: Negative for fever, sweats, chills or weight loss.  Respiratory: Negative for shortness of breath.   Cardiovascular: Negative for chest pain, palpitations and leg swelling.  Gastrointestinal: See HPI.  Musculoskeletal:  Negative for back pain or muscle aches.  Neurological: Negative for dizziness, headaches or paresthesias.   Physical Exam: BP (!) 142/82   Pulse 77   Ht '5\' 7"'$  (1.702 m)   Wt 206 lb 8 oz (93.7 kg)   BMI 32.34 kg/m   General: 56 year old male in no acute distress. Head: Normocephalic and atraumatic. Eyes: No scleral icterus. Conjunctiva pink . Ears: Normal auditory acuity. Mouth: Upper dentures, partial lower plate.  No ulcers or lesions.  Lungs: Clear throughout to auscultation. Heart: Regular rate and rhythm, no murmur. Abdomen: Soft, nontender and nondistended. No masses or hepatomegaly. Normal bowel sounds x 4 quadrants.  Rectal: Deferred. Musculoskeletal: Symmetrical with no gross deformities. Extremities: Mild lower extremity edema. Neurological: Alert oriented x 4. No focal deficits.   Psychological: Alert and cooperative. Normal mood and affect  Assessment and Recommendations:  2) 56 year old male with GERD complicated by peptic esophageal stricture with recurrent food impactions s/p EGD by Dr. Gala Romney at [facility] ED 06/27/2022.  -Continue Pantoprazole 40 mg p.o. twice daily -Avoid eating large pieces of meat and bread, chew food as thoroughly as possible and avoid eating when driving.  Drink 3 separate sips of tap water prior to swallowing any pills or food. -EGD with esophageal dilatation benefits and risks discussed including risk with sedation, risk of bleeding, perforation and infection  -Patient to contact office if symptoms worsen and to go to the ED if food bolus impaction recurs  2) History of a tubular adenomatous colon polyp removed from the transverse colon per colonoscopy 02/2019. -Next colon polyp surveillance colonoscopy due June/2027

## 2022-07-28 ENCOUNTER — Encounter: Payer: Self-pay | Admitting: Nurse Practitioner

## 2022-07-28 ENCOUNTER — Ambulatory Visit: Payer: 59 | Admitting: Nurse Practitioner

## 2022-07-28 VITALS — BP 142/82 | HR 77 | Ht 67.0 in | Wt 206.5 lb

## 2022-07-28 DIAGNOSIS — K222 Esophageal obstruction: Secondary | ICD-10-CM | POA: Diagnosis not present

## 2022-07-28 DIAGNOSIS — K2100 Gastro-esophageal reflux disease with esophagitis, without bleeding: Secondary | ICD-10-CM | POA: Diagnosis not present

## 2022-07-28 MED ORDER — PANTOPRAZOLE SODIUM 40 MG PO TBEC: 40.0000 mg | DELAYED_RELEASE_TABLET | Freq: Two times a day (BID) | ORAL | 2 refills | Status: DC

## 2022-07-28 NOTE — Patient Instructions (Addendum)
Avoid large pieces of food (meat and bread)   _____________________________________________________  If you are age 54 or older, your body mass index should be between 23-30. Your Body mass index is 32.34 kg/m. If this is out of the aforementioned range listed, please consider follow up with your Primary Care Provider.  If you are age 78 or younger, your body mass index should be between 19-25. Your Body mass index is 32.34 kg/m. If this is out of the aformentioned range listed, please consider follow up with your Primary Care Provider.   ________________________________________________________  The [facility] providers would like to encourage you to use [facility] to communicate with providers for non-urgent requests or questions.  Due to long hold times on the telephone, sending your provider a message by [facility] may be a faster and more efficient way to get a response.  Please allow 48 business hours for a response.  Please remember that this is for non-urgent requests.  _______________________________________________________  Dennis Bast have been scheduled for an endoscopy. Please follow written instructions given to you at your visit today. If you use inhalers (even only as needed), please bring them with you on the day of your procedure.  Due to recent changes in healthcare laws, you may see the results of your imaging and laboratory studies on MyChart before your provider has had a chance to review them.  We understand that in some cases there may be results that are confusing or concerning to you. Not all laboratory results come back in the same time frame and the provider may be waiting for multiple results in order to interpret others.  Please give [place] 48 hours in order for your provider to thoroughly review all the results before contacting the office for clarification of your results.    It was a pleasure to see you today!  Thank you for trusting me with your gastrointestinal care!

## 2022-07-29 NOTE — Progress Notes (Signed)
Addendum: Reviewed and agree with assessment and management plan. Tanikka Bresnan M, MD  

## 2022-08-04 IMAGING — CT CT HEART MORP W/ CTA COR W/ SCORE W/ CA W/CM &/OR W/O CM
4 of 7 series · 8 of 20 positions shown, 9 images · IV contrast (APPLIED)
Comparison: 09/26/2021 chest radiograph.
COMPARISON: 09/26/2021 chest radiograph.

Addendum:
EXAM:
OVER-READ INTERPRETATION  CT CHEST

The following report is an over-read performed by radiologist Dr.
Hideyasu Sogabe [REDACTED] on 11/02/2021. This over-read
does not include interpretation of cardiac or coronary anatomy or
pathology. The coronary CTA interpretation by the cardiologist is
attached.
TECHNIQUE: The patient was scanned on a Siemens [REDACTED]ice scanner. Gantry
rotation speed was 250 msecs. Collimation was 0.6 mm. A 100 kV
prospective scan was triggered in the ascending thoracic aorta at
35-75% of the R-R interval. Average HR during the scan was 60 bpm.
The 3D data set was interpreted on a dedicated work station using
MPR, MIP and VRT modes. A total of 80cc of contrast was used.

[Series 6: best diast · axial · 0.37mm/px · z∈[-190,-157]mm · 2 of 246 slices shown, 3 images]
[im 82/246  vessel]
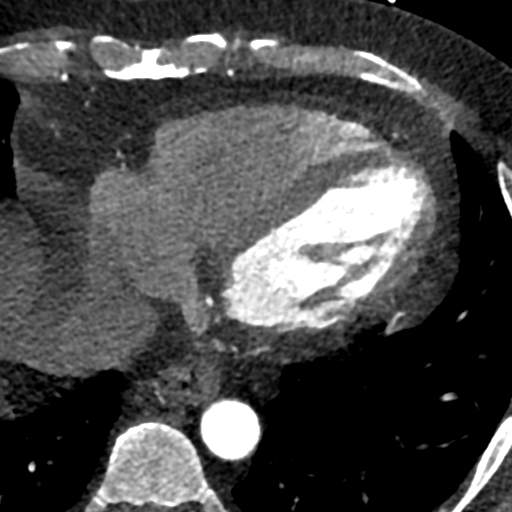
[im 82/246  lung]
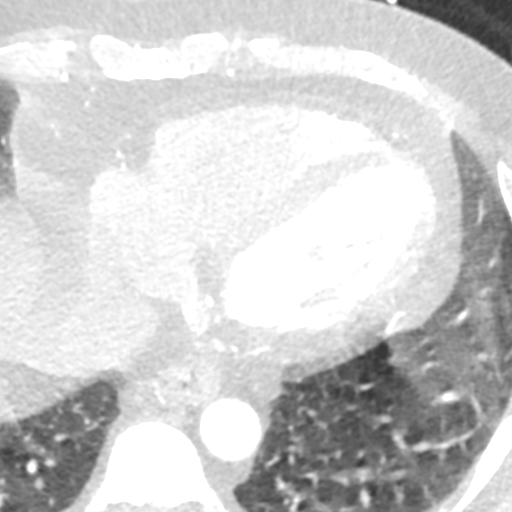
[im 164/246  vessel]
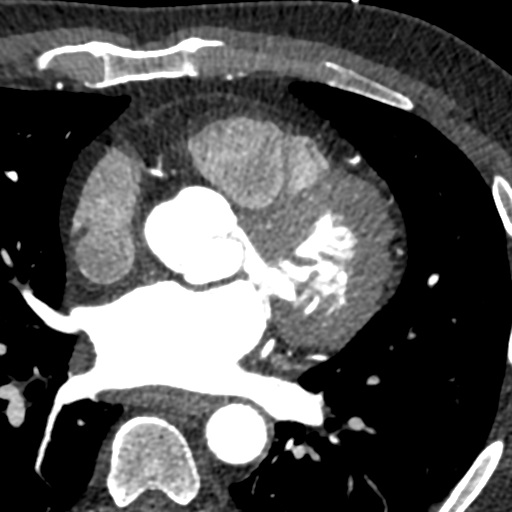

[Series 7: best syst · axial · 0.37mm/px · z∈[-190,-157]mm · 2 of 246 slices shown]
[im 82/246  vessel]
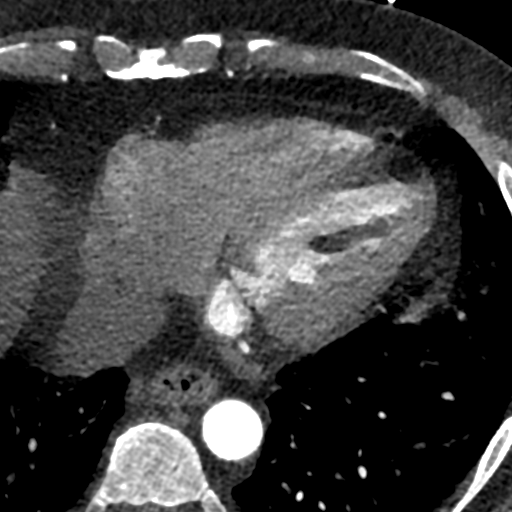
[im 164/246  vessel]
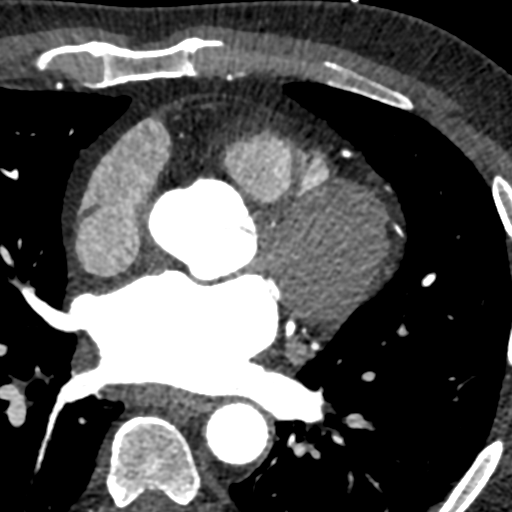

[Series 8: ts diast sharp · axial · 0.37mm/px · z∈[-190,-157]mm · 2 of 246 slices shown]
[im 82/246  lung]
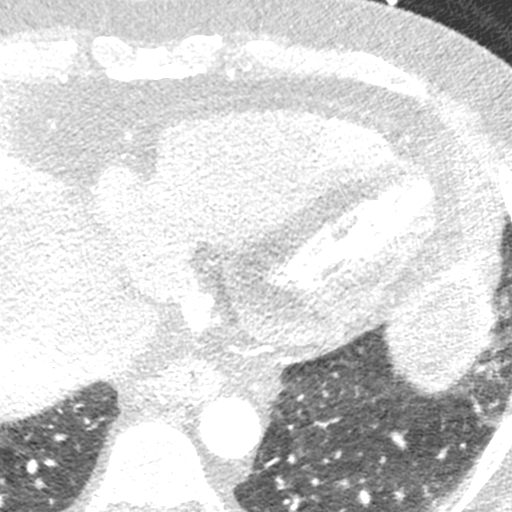
[im 164/246  lung]
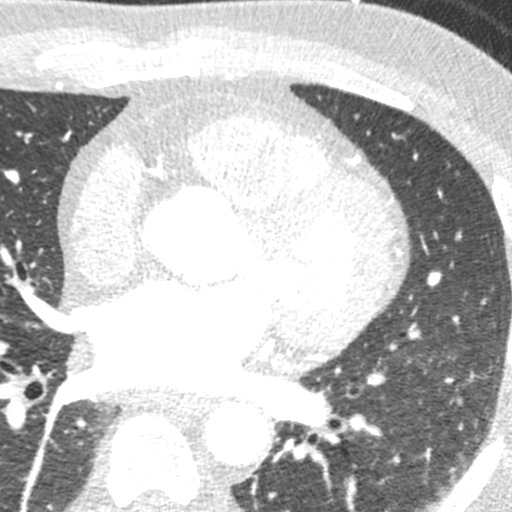

[Series 9: ts syst sharp · axial · 0.37mm/px · z∈[-190,-157]mm · 2 of 246 slices shown]
[im 82/246  lung]
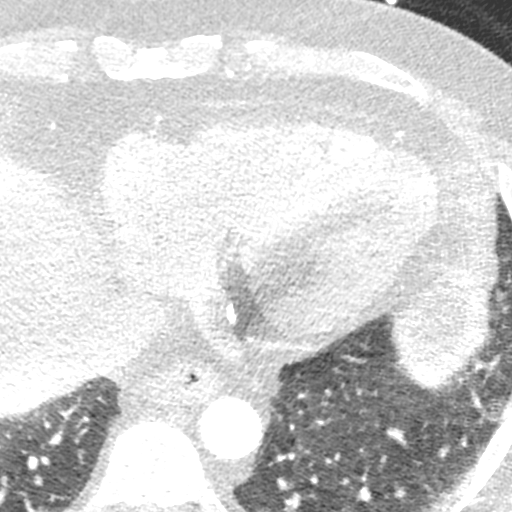
[im 164/246  lung]
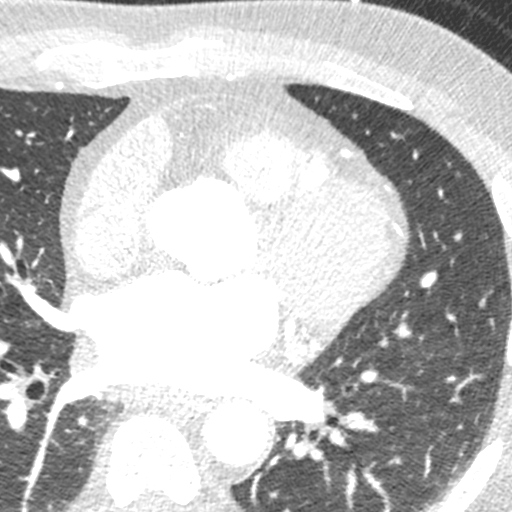

[8 of 20 positions shown; findings below may reference images not displayed]

FINDINGS: Vascular: Normal aortic caliber. No central pulmonary embolism, on
this non-dedicated study.

Mediastinum/Nodes: No imaged thoracic adenopathy.

Lungs/Pleura: No pleural fluid. 3 mm right lower lobe pulmonary
nodule on [DATE].

Upper Abdomen: Mild hepatic steatosis. Normal imaged portions of the
spleen, stomach.

Musculoskeletal: No acute osseous abnormality.
IMPRESSION: No acute findings in the imaged extracardiac chest.

Mild hepatic steatosis.

3 mm right lower lobe pulmonary nodule. No follow-up needed if
patient is low-risk. Non-contrast chest CT can be considered in 12
months if patient is high-risk. This recommendation follows the
consensus statement: Guidelines for Management of Incidental
Pulmonary Nodules Detected on CT Images: From the [REDACTED]AL DATA:  Chest pain

EXAM:
Cardiac CTA

MEDICATIONS:
Sub lingual nitro. 4mg x 2
FINDINGS: Non-cardiac: See separate report from [REDACTED].

Pulmonary veins drain normally to the left atrium. No LA appendage
thrombus noted.

Calcium Score: 0 Agatston units.

Coronary Arteries: Left dominant with no anomalies

LM: No plaque or stenosis.

LAD system: No plaque or stenosis.

Circumflex system: Misregistration artifact in the distal LCx.
However, no evidence for plaque or stenosis seen.

RCA system: Small, nondominant RCA with no plaque or stenosis.
IMPRESSION: 1. Coronary calcium score 0 Agatston units, suggesting low risk for
future cardiac events.

2.  No significant coronary disease noted.

Esii Faude

*** End of Addendum ***
EXAM:
OVER-READ INTERPRETATION  CT CHEST

The following report is an over-read performed by radiologist Dr.
Hideyasu Sogabe [REDACTED] on 11/02/2021. This over-read
does not include interpretation of cardiac or coronary anatomy or
pathology. The coronary CTA interpretation by the cardiologist is
attached.
FINDINGS: Vascular: Normal aortic caliber. No central pulmonary embolism, on
this non-dedicated study.

Mediastinum/Nodes: No imaged thoracic adenopathy.

Lungs/Pleura: No pleural fluid. 3 mm right lower lobe pulmonary
nodule on [DATE].

Upper Abdomen: Mild hepatic steatosis. Normal imaged portions of the
spleen, stomach.

Musculoskeletal: No acute osseous abnormality.
IMPRESSION: No acute findings in the imaged extracardiac chest.

Mild hepatic steatosis.

3 mm right lower lobe pulmonary nodule. No follow-up needed if
patient is low-risk. Non-contrast chest CT can be considered in 12
months if patient is high-risk. This recommendation follows the
consensus statement: Guidelines for Management of Incidental
Pulmonary Nodules Detected on CT Images: From the [HOSPITAL]

## 2022-08-15 ENCOUNTER — Ambulatory Visit (AMBULATORY_SURGERY_CENTER): Payer: 59 | Admitting: Internal Medicine

## 2022-08-15 ENCOUNTER — Encounter: Payer: Self-pay | Admitting: Internal Medicine

## 2022-08-15 VITALS — BP 126/69 | HR 64 | Temp 97.5°F | Resp 17 | Ht 67.0 in | Wt 206.0 lb

## 2022-08-15 DIAGNOSIS — K2100 Gastro-esophageal reflux disease with esophagitis, without bleeding: Secondary | ICD-10-CM

## 2022-08-15 DIAGNOSIS — K2289 Other specified disease of esophagus: Secondary | ICD-10-CM | POA: Diagnosis not present

## 2022-08-15 DIAGNOSIS — R1310 Dysphagia, unspecified: Secondary | ICD-10-CM

## 2022-08-15 DIAGNOSIS — K2950 Unspecified chronic gastritis without bleeding: Secondary | ICD-10-CM

## 2022-08-15 DIAGNOSIS — K222 Esophageal obstruction: Secondary | ICD-10-CM

## 2022-08-15 DIAGNOSIS — K200 Eosinophilic esophagitis: Secondary | ICD-10-CM | POA: Diagnosis not present

## 2022-08-15 MED ORDER — SODIUM CHLORIDE 0.9 % IV SOLN: 500.0000 mL | Freq: Once | INTRAVENOUS | Status: DC

## 2022-08-15 NOTE — Progress Notes (Signed)
VS completed by DT.  Pt's states no medical or surgical changes since previsit or office visit.  

## 2022-08-15 NOTE — Patient Instructions (Addendum)
-   Advance diet as tolerated. - Continue present medications including Pantoprazole 40 mg BID-AC. - Await pathology results. - Repeat upper endoscopy for retreatment (if residual dysphagia then repeat in 4 weeks; if resolution of dysphagia then repeat in 6 months).  YOU HAD AN ENDOSCOPIC PROCEDURE TODAY AT [facility]:   Refer to the procedure report that was given to you for any specific questions about what was found during the examination.  If the procedure report does not answer your questions, please call your gastroenterologist to clarify.  If you requested that your care partner not be given the details of your procedure findings, then the procedure report has been included in a sealed envelope for you to review at your convenience later.  YOU SHOULD EXPECT: Some feelings of bloating in the abdomen. Passage of more gas than usual.  Walking can help get rid of the air that was put into your GI tract during the procedure and reduce the bloating. If you had a lower endoscopy (such as a colonoscopy or flexible sigmoidoscopy) you may notice spotting of blood in your stool or on the toilet paper. If you underwent a bowel prep for your procedure, you may not have a normal bowel movement for a few days.  Please Note:  You might notice some irritation and congestion in your nose or some drainage.  This is from the oxygen used during your procedure.  There is no need for concern and it should clear up in a day or so.  SYMPTOMS TO REPORT IMMEDIATELY:   Following upper endoscopy (EGD)  Vomiting of blood or coffee ground material  New chest pain or pain under the shoulder blades  Painful or persistently difficult swallowing  New shortness of breath  Fever of 100F or higher  Black, tarry-looking stools  For urgent or emergent issues, a gastroenterologist can be reached at any hour by calling [phone]. Do not use MyChart messaging for urgent concerns.    DIET:  We do  recommend a small meal at first, but then you may proceed to your regular diet.  Drink plenty of fluids but you should avoid alcoholic beverages for 24 hours.  ACTIVITY:  You should plan to take it easy for the rest of today and you should NOT DRIVE or use heavy machinery until tomorrow (because of the sedation medicines used during the test).    FOLLOW UP: Our staff will call the number listed on your records the next business day following your procedure.  We will call around 7:15- 8:00 am to check on you and address any questions or concerns that you may have regarding the information given to you following your procedure. If we do not reach you, we will leave a message.     If any biopsies were taken you will be contacted by phone or by letter within the next 1-3 weeks.  Please call us at [phone] if you have not heard about the biopsies in 3 weeks.    SIGNATURES/CONFIDENTIALITY: You and/or your care partner have signed paperwork which will be entered into your electronic medical record.  These signatures attest to the fact that that the information above on your After Visit Summary has been reviewed and is understood.  Full responsibility of the confidentiality of this discharge information lies with you and/or your care-partner.

## 2022-08-15 NOTE — Progress Notes (Signed)
Patient presenting for upper in the [facility] today to evaluate peptic stricture, GERD and dysphagia Recent food impaction treated by Dr. Gala Romney at [facility]  He remains appropriate for upper endoscopy in the [facility] today Since seeing Dr. Gala Romney he has been back on pantoprazole 40 mg twice daily Still dysphagia intermittent but no further impactions

## 2022-08-15 NOTE — Op Note (Signed)
[place] Patient Name: Raymond Mccarthy Procedure Date: 08/15/2022 9:50 AM MRN: [id number] Endoscopist: Jerene Bears , MD, [id number] Age: 56 Referring MD:  Date of Birth: February 17, 1966 Gender: Male Account #: [id number] Procedure:                Upper GI endoscopy Indications:              Dysphagia, Gastro-esophageal reflux disease, recent                            esophageal food impaction; now back to pantoprazole                            40 mg BID having previously decreased to once daily                            before recurrent dysphagia; dilation to 17 mm with                            balloon in May 2022 Medicines:                Monitored Anesthesia Care Procedure:                Pre-Anesthesia Assessment:                           - Prior to the procedure, a History and Physical                            was performed, and patient medications and                            allergies were reviewed. The patient's tolerance of                            previous anesthesia was also reviewed. The risks                            and benefits of the procedure and the sedation                            options and risks were discussed with the patient.                            All questions were answered, and informed consent                            was obtained. Prior Anticoagulants: The patient has                            taken no anticoagulant or antiplatelet agents. ASA                            Grade Assessment: II - A patient with mild systemic  disease. After reviewing the risks and benefits,                            the patient was deemed in satisfactory condition to                            undergo the procedure.                           After obtaining informed consent, the endoscope was                            passed under direct vision. Throughout the                            procedure, the patient's blood  pressure, pulse, and                            oxygen saturations were monitored continuously. The                            GIF [id number] #[id number] was introduced through the                            mouth, and advanced to the second part of duodenum.                            The upper GI endoscopy was accomplished without                            difficulty. The patient tolerated the procedure                            well. Scope In: Scope Out: Findings:                 Mucosal changes including feline appearance,                            circumferential folds and longitudinal markings                            were found in the entire esophagus. Biopsies were                            obtained from the proximal and distal esophagus                            with cold forceps for histology of suspected                            eosinophilic esophagitis.                           One benign-appearing, intrinsic moderate                            (  circumferential scarring or stenosis; an endoscope                            may pass) stenosis was found 40 cm from the                            incisors. This stenosis measured 1.2 cm (inner                            diameter) x less than one cm (in length). The                            stenosis was traversed. A TTS dilator was passed                            through the scope. Dilation with a 16-17-18 mm                            balloon dilator was performed to 16 mm. The                            dilation site was examined and showed moderate                            mucosal disruption and moderate improvement in                            luminal narrowing.                           The cardia and gastric fundus were normal on                            retroflexion.                           The entire examined stomach was normal.                           The examined duodenum was normal. Complications:             No immediate complications. Estimated Blood Loss:     Estimated blood loss was minimal. Impression:               - Esophageal mucosal changes suspicious for                            eosinophilic esophagitis.                           - Benign-appearing esophageal stenosis. Dilated to                            16 mm with balloon.                           -  Normal stomach.                           - Normal examined duodenum.                           - Biopsies were taken with a cold forceps for                            evaluation of eosinophilic esophagitis. Recommendation:           - Patient has a contact number available for                            emergencies. The signs and symptoms of potential                            delayed complications were discussed with the                            patient. Return to normal activities tomorrow.                            Written discharge instructions were provided to the                            patient.                           - Advance diet as tolerated.                           - Continue present medications including                            pantoprazole 40 mg BID-AC.                           - Await pathology results.                           - Repeat upper endoscopy for retreatment (if                            residual dysphagia then repeat in 4 weeks; if                            resolution of dysphagia then repeat in 6 months). Jerene Bears, MD 08/15/2022 10:28:31 AM This report has been signed electronically.

## 2022-08-15 NOTE — Progress Notes (Signed)
Called to room to assist during endoscopic procedure.  Patient ID and intended procedure confirmed with present staff. Received instructions for my participation in the procedure from the performing physician.  

## 2022-08-15 NOTE — Progress Notes (Signed)
A and O x3. Report to RN. Tolerated MAC anesthesia well.Teeth unchanged after procedure. 

## 2022-08-16 ENCOUNTER — Telehealth: Payer: Self-pay | Admitting: *Deleted

## 2022-08-16 NOTE — Telephone Encounter (Signed)
  Follow up Call-     08/15/2022    9:24 AM 01/27/2021    9:22 AM 12/02/2020    9:40 AM  Call back number  Post procedure Call Back phone  # [phone]  Permission to leave phone message Yes Yes Yes     Patient questions:  Do you have a fever, pain , or abdominal swelling? No. Pain Score  0 *  Have you tolerated food without any problems? Yes.    Have you been able to return to your normal activities? Yes.    Do you have any questions about your discharge instructions: Diet   No. Medications  No. Follow up visit  No.  Do you have questions or concerns about your Care? No.  Actions: * If pain score is 4 or above: No action needed, pain <4.

## 2022-08-17 ENCOUNTER — Encounter: Payer: Self-pay | Admitting: Internal Medicine

## 2023-08-06 ENCOUNTER — Encounter: Payer: Self-pay | Admitting: Internal Medicine

## 2024-01-07 ENCOUNTER — Emergency Department (HOSPITAL_COMMUNITY)

## 2024-01-07 ENCOUNTER — Emergency Department (HOSPITAL_COMMUNITY): Admitting: Anesthesiology

## 2024-01-07 ENCOUNTER — Encounter (HOSPITAL_COMMUNITY)
Admission: EM | Disposition: A | Discharge status: Home / Self Care | Payer: Self-pay | Source: Home / Self Care | Attending: Emergency Medicine

## 2024-01-07 ENCOUNTER — Emergency Department (HOSPITAL_COMMUNITY)
Admission: EM | Admit: 2024-01-07 | Discharge: 2024-01-07 | Disposition: A | Discharge status: Home / Self Care | Attending: Emergency Medicine | Admitting: Emergency Medicine

## 2024-01-07 ENCOUNTER — Other Ambulatory Visit: Payer: Self-pay

## 2024-01-07 ENCOUNTER — Encounter (HOSPITAL_COMMUNITY): Payer: Self-pay

## 2024-01-07 DIAGNOSIS — K222 Esophageal obstruction: Secondary | ICD-10-CM | POA: Diagnosis not present

## 2024-01-07 DIAGNOSIS — K219 Gastro-esophageal reflux disease without esophagitis: Secondary | ICD-10-CM

## 2024-01-07 DIAGNOSIS — T18128A Food in esophagus causing other injury, initial encounter: Secondary | ICD-10-CM | POA: Diagnosis present

## 2024-01-07 DIAGNOSIS — W44F3XA Food entering into or through a natural orifice, initial encounter: Secondary | ICD-10-CM

## 2024-01-07 DIAGNOSIS — K2970 Gastritis, unspecified, without bleeding: Secondary | ICD-10-CM | POA: Insufficient documentation

## 2024-01-07 DIAGNOSIS — K2289 Other specified disease of esophagus: Secondary | ICD-10-CM

## 2024-01-07 DIAGNOSIS — K2990 Gastroduodenitis, unspecified, without bleeding: Secondary | ICD-10-CM | POA: Diagnosis not present

## 2024-01-07 DIAGNOSIS — R1310 Dysphagia, unspecified: Secondary | ICD-10-CM | POA: Diagnosis not present

## 2024-01-07 DIAGNOSIS — K2980 Duodenitis without bleeding: Secondary | ICD-10-CM | POA: Insufficient documentation

## 2024-01-07 DIAGNOSIS — R1319 Other dysphagia: Secondary | ICD-10-CM

## 2024-01-07 HISTORY — PX: ESOPHAGOGASTRODUODENOSCOPY: SHX5428

## 2024-01-07 SURGERY — EGD (ESOPHAGOGASTRODUODENOSCOPY)
Anesthesia: General

## 2024-01-07 MED ORDER — PROPOFOL 10 MG/ML IV BOLUS
INTRAVENOUS | Status: AC
  Filled 2024-01-07: qty 20

## 2024-01-07 MED ORDER — LIDOCAINE HCL (CARDIAC) PF 100 MG/5ML IV SOSY
PREFILLED_SYRINGE | INTRAVENOUS | Status: DC | PRN
  Administered 2024-01-07: 100 mg via INTRAVENOUS

## 2024-01-07 MED ORDER — SUCCINYLCHOLINE CHLORIDE 20 MG/ML IJ SOLN
INTRAMUSCULAR | Status: DC | PRN
  Administered 2024-01-07: 120 mg via INTRAVENOUS

## 2024-01-07 MED ORDER — PANTOPRAZOLE SODIUM 40 MG PO TBEC: 40.0000 mg | DELAYED_RELEASE_TABLET | Freq: Two times a day (BID) | ORAL | 11 refills | Status: AC

## 2024-01-07 MED ORDER — SUCCINYLCHOLINE CHLORIDE 200 MG/10ML IV SOSY
PREFILLED_SYRINGE | INTRAVENOUS | Status: AC
  Filled 2024-01-07: qty 10

## 2024-01-07 MED ORDER — PROPOFOL 10 MG/ML IV BOLUS
INTRAVENOUS | Status: DC | PRN
  Administered 2024-01-07: 200 mg via INTRAVENOUS

## 2024-01-07 MED ORDER — MIDAZOLAM HCL 2 MG/2ML IJ SOLN
INTRAMUSCULAR | Status: AC
  Filled 2024-01-07: qty 2

## 2024-01-07 MED ORDER — MIDAZOLAM HCL 5 MG/5ML IJ SOLN
INTRAMUSCULAR | Status: DC | PRN
  Administered 2024-01-07: 2 mg via INTRAVENOUS

## 2024-01-07 MED ORDER — LACTATED RINGERS IV SOLN
INTRAVENOUS | Status: DC | PRN
Start: 2024-01-07 — End: 2024-01-07

## 2024-01-07 MED ORDER — GLUCAGON HCL RDNA (DIAGNOSTIC) 1 MG IJ SOLR
1.0000 mg | Freq: Once | INTRAMUSCULAR | Status: AC
  Administered 2024-01-07: 1 mg via INTRAVENOUS
  Filled 2024-01-07: qty 1

## 2024-01-07 MED ORDER — LIDOCAINE 2% (20 MG/ML) 5 ML SYRINGE
INTRAMUSCULAR | Status: AC
  Filled 2024-01-07: qty 5

## 2024-01-07 NOTE — ED Triage Notes (Signed)
 Pt arrived via POV c/o swallowing a piece of steak and reports it is stuck in his esophagus. Pt reports calling GI office due to upcoming appointment on Wednesday this week and being advised to seek treatment in ER. Pt reports Hx of this problem occurring in the past a few times and has had his esophagus stretched before. Pt reports he is unable to swallow anything at this present time.

## 2024-01-07 NOTE — ED Provider Notes (Signed)
 [place] EMERGENCY DEPARTMENT AT [facility] Provider Note   CSN: [id number] Arrival date & time: 01/07/24  1651     History  Chief Complaint  Patient presents with   Swallowed Foreign Body    Raymond Mccarthy is a 58 y.o. male with history of eosinophilic esophagitis who presents with food impaction.  Patient was eating steak this morning and it became impacted in his chest at 9:30 AM.  He is try to get it out all day whether by swallowing it or vomiting it up which sometimes does work but it has not worked today.  He has had esophageal dilation several times in the past.  This has happened several times before and he always has to go to endoscopy.  Coca-Cola or glucagon  do not work for him typically.  He was scheduled for Wednesday with his gastroenterologist Dr. Bridgett Camps to see about having another dilation.  He denies any chest pain prior to the impaction.  States there were no bones in what he was eating.  Denies any abdominal pain   Past Medical History:  Diagnosis Date   Eosinophilic esophagitis        Home Medications Prior to Admission medications   Medication Sig Start Date End Date Taking? Authorizing Provider  allopurinol (ZYLOPRIM) 300 MG tablet Take 300 mg by mouth daily as needed (gout). 02/13/20   [provider]  metoprolol  tartrate (LOPRESSOR ) 50 MG tablet Take 1 tablet (50 mg total) by mouth once for 1 dose. PLEASE TAKE METOPROLOL  2  HOURS PRIOR TO CTA SCAN. 10/21/21 08/15/22  Bridgette Campus, MD  pantoprazole  (PROTONIX ) 40 MG tablet Take 1 tablet (40 mg total) by mouth 2 (two) times daily. 01/07/24 01/06/25  Vinetta Greening, DO      Allergies    Bee venom and Penicillins    Review of Systems   Review of Systems A 10 point review of systems was performed and is negative unless otherwise reported in HPI.  Physical Exam Updated Vital Signs BP (!) 153/92   Pulse 82   Temp 97.8 F (36.6 C)   Resp 20   Ht 5\' 7"  (1.702 m)   Wt 93.4 kg    SpO2 96%   BMI 32.25 kg/m  Physical Exam General: Uncomfortable appearing male, lying in bed. Spitting into emesis bag.  HEENT: PERRLA, Sclera anicteric, MMM, trachea midline.  Cardiology: RRR, no murmurs/rubs/gallops. Resp: Normal respiratory rate and effort. CTAB, no wheezes, rhonchi, crackles.  Abd: Soft, non-tender, non-distended. No rebound tenderness or guarding.  GU: Deferred. MSK: No peripheral edema or signs of trauma. Extremities without deformity or TTP. No cyanosis or clubbing. Skin: warm, dry.  Back: No CVA tenderness Neuro: A&Ox4, CNs II-XII grossly intact. MAEs. Sensation grossly intact.  Psych: Normal mood and affect.   ED Results / Procedures / Treatments   Labs (all labs ordered are listed, but only abnormal results are displayed) Labs Reviewed - No data to display  EKG None  Radiology DG Chest 1 View Result Date: 01/07/2024 CLINICAL DATA:  Piece of steak got lodged in esophagus while eating dinner this evening. History of esophageal dilation. EXAM: CHEST  1 VIEW COMPARISON:  10/06/2021 FINDINGS: Normal cardiomediastinal silhouette. No focal consolidation, pleural effusion, or pneumothorax. No displaced rib fractures. No radiopaque foreign body. IMPRESSION: No radiopaque foreign body. Electronically Signed   By: Rozell Cornet M.D.   On: 01/07/2024 20:15   DG Neck Soft Tissue Result Date: 01/07/2024 CLINICAL DATA:  Rule out foreign  body. Eating dinner this evening and a piece of steak got lodged in esophagus. History of esophageal dilation. EXAM: NECK SOFT TISSUES - 1+ VIEW COMPARISON:  None Available. FINDINGS: There is no evidence of retropharyngeal soft tissue swelling or epiglottic enlargement. The cervical airway is unremarkable and no radio-opaque foreign body identified. IMPRESSION: No radiopaque foreign body. Electronically Signed   By: Rozell Cornet M.D.   On: 01/07/2024 20:15    Procedures Procedures    Medications Ordered in ED Medications   glucagon  (human recombinant) (GLUCAGEN ) injection 1 mg (1 mg Intravenous Given 01/07/24 2118)    ED Course/ Medical Decision Making/ A&P                          Medical Decision Making Amount and/or Complexity of Data Reviewed Radiology:  Decision-making details documented in ED Course.  Risk Prescription drug management. Decision regarding hospitalization.    MDM:    Patient with likely esophageal food impaction.  Seems to be impacted at the gastroesophageal junction based on the location of his discomfort.  Largely unable to tolerate oral secretions.  Coca-Cola does not improve and he vomits this back up. Given clinical scenario low concern for other causes of chest pain including ACS or PTX.  Reports no bones in the meat that he consumed and chest x-ray demonstrates no radiopaque foreign body.  Patient does have history of this extensively and has required multiple trips to the endoscopy suite.  Does have history of EE and was due for a possible dilation soon.  Will consult with GI.   Clinical Course as of 01/07/24 2327  Mon Jan 07, 2024  2018 DG Chest 1 View No radioopaque foreign body [HN]  2059 Tried several swallows of Coca-Cola which patient subsequently vomited back up.  Will try glucagon  and consult with GI. [HN]  2111 Dr. [name] will call team in for endoscopy. [HN]    Clinical Course User Index [HN] Merdis Stalling, MD   Imaging Studies ordered: Chest ray and neck x-ray ordered from triage I independently visualized and interpreted imaging. I agree with the radiologist interpretation  Additional history obtained from chart review  Reevaluation: After the interventions noted above, I reevaluated the patient and found that they have :stayed the same  Social Determinants of Health:  lives independently  Disposition:  to endoscopy  Co morbidities that complicate the patient evaluation  Past Medical History:  Diagnosis Date   Eosinophilic esophagitis       Medicines Meds ordered this encounter  Medications   glucagon  (human recombinant) (GLUCAGEN ) injection 1 mg   pantoprazole  (PROTONIX ) 40 MG tablet    Sig: Take 1 tablet (40 mg total) by mouth 2 (two) times daily.    Dispense:  60 tablet    Refill:  11    I have reviewed the patients home medicines and have made adjustments as needed  Problem List / ED Course: Problem List Items Addressed This Visit       Digestive   Esophageal obstruction due to food impaction - Primary                This note was created using dictation software, which may contain spelling or grammatical errors.    Merdis Stalling, MD 01/07/24 [phone]

## 2024-01-07 NOTE — Consult Note (Addendum)
 Consulting  Provider: Dr. Drury Geralds Primary Care Physician:  Orlena Bitters, MD Primary Gastroenterologist: Previously Dr. Bridgett Camps  Reason for Consultation: Esophageal food impaction  HPI:  Raymond Mccarthy is a 58 y.o. male with a past medical history of chronic GERD, esophageal stricture, recurrent food impactions in the past last of which was 06/27/2022 who presented to [facility], ER this evening after swallowing a piece of steak this morning that felt lodged in his esophagus.  Has been unable to tolerate any p.o. liquids or solids since that time.  History of food impaction in 2023, 2021, 2020.  Has received multiple esophageal dilations in the past.  Previous concern for possible EOE though previous biopsies showed findings consistent with reflux esophagitis.  Takes pantoprazole  40 mg daily.   In the ER, patient was given glucagon  and soda without relief of his symptoms.  X-rays of patient's neck and chest which I personally reviewed were unremarkable.  Past Medical History:  Diagnosis Date   Eosinophilic esophagitis     Past Surgical History:  Procedure Laterality Date   BIOPSY  01/01/2019   Procedure: BIOPSY;  Surgeon: Nannette Babe, MD;  Location: [facility] ENDOSCOPY;  Service: Gastroenterology;;   ESOPHAGOGASTRODUODENOSCOPY N/A 01/01/2019   Procedure: ESOPHAGOGASTRODUODENOSCOPY (EGD);  Surgeon: Nannette Babe, MD;  Location: [facility] ENDOSCOPY;  Service: Gastroenterology;  Laterality: N/A;   ESOPHAGOGASTRODUODENOSCOPY (EGD) WITH PROPOFOL  N/A 07/30/2020   Procedure: ESOPHAGOGASTRODUODENOSCOPY (EGD) WITH PROPOFOL ;  Surgeon: Tobin Forts, MD;  Location: [facility] ENDOSCOPY;  Service: Endoscopy;  Laterality: N/A;   ESOPHAGOGASTRODUODENOSCOPY (EGD) WITH PROPOFOL  N/A 06/27/2022   Procedure: ESOPHAGOGASTRODUODENOSCOPY (EGD) WITH PROPOFOL ;  Surgeon: Suzette Espy, MD;  Location: [facility] ENDO SUITE;  Service: Endoscopy;  Laterality: N/A;   FOREIGN BODY REMOVAL  01/01/2019   Procedure: FOREIGN BODY REMOVAL;   Surgeon: Nannette Babe, MD;  Location: [facility] ENDOSCOPY;  Service: Gastroenterology;;   FOREIGN BODY REMOVAL  07/30/2020   Procedure: FOREIGN BODY REMOVAL;  Surgeon: Tobin Forts, MD;  Location: [facility] ENDOSCOPY;  Service: Endoscopy;;   UPPER GASTROINTESTINAL ENDOSCOPY      Prior to Admission medications   Medication Sig Start Date End Date Taking? Authorizing Provider  allopurinol (ZYLOPRIM) 300 MG tablet Take 300 mg by mouth daily as needed (gout). 02/13/20   [provider]  metoprolol  tartrate (LOPRESSOR ) 50 MG tablet Take 1 tablet (50 mg total) by mouth once for 1 dose. PLEASE TAKE METOPROLOL  2  HOURS PRIOR TO CTA SCAN. 10/21/21 08/15/22  Bridgette Campus, MD  pantoprazole  (PROTONIX ) 40 MG tablet Take 1 tablet (40 mg total) by mouth 2 (two) times daily. 07/28/22   Tory Freiberg, NP    No current facility-administered medications for this encounter.   Current Outpatient Medications  Medication Sig Dispense Refill   allopurinol (ZYLOPRIM) 300 MG tablet Take 300 mg by mouth daily as needed (gout).     metoprolol  tartrate (LOPRESSOR ) 50 MG tablet Take 1 tablet (50 mg total) by mouth once for 1 dose. PLEASE TAKE METOPROLOL  2  HOURS PRIOR TO CTA SCAN. 1 tablet 0   pantoprazole  (PROTONIX ) 40 MG tablet Take 1 tablet (40 mg total) by mouth 2 (two) times daily. 60 tablet 2    Allergies as of 01/07/2024 - Review Complete 01/07/2024  Allergen Reaction Noted   Bee venom Hives 04/19/2021   Penicillins Hives 04/19/2021    Family History  Problem Relation Age of Onset   COPD Mother    Dementia Father    Colon cancer Neg Hx  Esophageal cancer Neg Hx    Stomach cancer Neg Hx    Pancreatic cancer Neg Hx    Liver disease Neg Hx    Colon polyps Neg Hx    Rectal cancer Neg Hx     Social History   Socioeconomic History   Marital status: Married    Spouse name: Not on file   Number of children: Not on file   Years of education: Not on file   Highest education level: Not on file   Occupational History   Not on file  Tobacco Use   Smoking status: Never    Passive exposure: Never   Smokeless tobacco: Never  Vaping Use   Vaping status: Never Used  Substance and Sexual Activity   Alcohol use: Not Currently   Drug use: Not Currently   Sexual activity: Not on file  Other Topics Concern   Not on file  Social History Narrative   Not on file   Social Drivers of Health   Financial Resource Strain: Not on file  Food Insecurity: Not on file  Transportation Needs: Not on file  Physical Activity: Not on file  Stress: Not on file  Social Connections: Not on file  Intimate Partner Violence: Not on file    Review of Systems: General: Negative for anorexia, weight loss, fever, chills, fatigue, weakness. Eyes: Negative for vision changes.  ENT: Negative for hoarseness, difficulty swallowing , nasal congestion. CV: Negative for chest pain, angina, palpitations, dyspnea on exertion, peripheral edema.  Respiratory: Negative for dyspnea at rest, dyspnea on exertion, cough, sputum, wheezing.  GI: See history of present illness. GU:  Negative for dysuria, hematuria, urinary incontinence, urinary frequency, nocturnal urination.  MS: Negative for joint pain, low back pain.  Derm: Negative for rash or itching.  Neuro: Negative for weakness, abnormal sensation, seizure, frequent headaches, memory loss, confusion.  Psych: Negative for anxiety, depression Endo: Negative for unusual weight change.  Heme: Negative for bruising or bleeding. Allergy: Negative for rash or hives.  Physical Exam: Vital signs in last 24 hours: Temp:  [98.3 F (36.8 C)] 98.3 F (36.8 C) (04/28 1704) Pulse Rate:  [74-77] 76 (04/28 2100) Resp:  [16-18] 17 (04/28 2100) BP: (160-184)/(89-96) 163/96 (04/28 2100) SpO2:  [97 %-99 %] 97 % (04/28 2100) Weight:  [93.4 kg] 93.4 kg (04/28 1706)   General:   Alert,  Well-developed, well-nourished, pleasant and cooperative in NAD Head:  Normocephalic and  atraumatic. Eyes:  Sclera clear, no icterus.   Conjunctiva pink. Ears:  Normal auditory acuity. Nose:  No deformity, discharge,  or lesions. Mouth:  No deformity or lesions, dentition normal. Neck:  Supple; no masses or thyromegaly. Lungs:  Clear throughout to auscultation.   No wheezes, crackles, or rhonchi. No acute distress. Heart:  Regular rate and rhythm; no murmurs, clicks, rubs,  or gallops. Abdomen:  Soft, nontender and nondistended. No masses, hepatosplenomegaly or hernias noted. Normal bowel sounds, without guarding, and without rebound.   Msk:  Symmetrical without gross deformities. Normal posture. Pulses:  Normal pulses noted. Extremities:  Without clubbing or edema. Neurologic:  Alert and  oriented x4;  grossly normal neurologically. Skin:  Intact without significant lesions or rashes. Cervical Nodes:  No significant cervical adenopathy. Psych:  Alert and cooperative. Normal mood and affect.  Intake/Output from previous day: No intake/output data recorded. Intake/Output this shift: No intake/output data recorded.  Lab Results: No results for input(s): "WBC", "HGB", "HCT", "PLT" in the last 72 hours. BMET No results for input(s): "  NA", "K", "CL", "CO2", "GLUCOSE", "BUN", "CREATININE", "CALCIUM" in the last 72 hours. LFT No results for input(s): "PROT", "ALBUMIN", "AST", "ALT", "ALKPHOS", "BILITOT", "BILIDIR", "IBILI" in the last 72 hours. PT/INR No results for input(s): "LABPROT", "INR" in the last 72 hours. Hepatitis Panel No results for input(s): "HEPBSAG", "HCVAB", "HEPAIGM", "HEPBIGM" in the last 72 hours. C-Diff No results for input(s): "CDIFFTOX" in the last 72 hours.  Studies/Results: DG Chest 1 View Result Date: 01/07/2024 CLINICAL DATA:  Piece of steak got lodged in esophagus while eating dinner this evening. History of esophageal dilation. EXAM: CHEST  1 VIEW COMPARISON:  10/06/2021 FINDINGS: Normal cardiomediastinal silhouette. No focal consolidation,  pleural effusion, or pneumothorax. No displaced rib fractures. No radiopaque foreign body. IMPRESSION: No radiopaque foreign body. Electronically Signed   By: Rozell Cornet M.D.   On: 01/07/2024 20:15   DG Neck Soft Tissue Result Date: 01/07/2024 CLINICAL DATA:  Rule out foreign body. Eating dinner this evening and a piece of steak got lodged in esophagus. History of esophageal dilation. EXAM: NECK SOFT TISSUES - 1+ VIEW COMPARISON:  None Available. FINDINGS: There is no evidence of retropharyngeal soft tissue swelling or epiglottic enlargement. The cervical airway is unremarkable and no radio-opaque foreign body identified. IMPRESSION: No radiopaque foreign body. Electronically Signed   By: Rozell Cornet M.D.   On: 01/07/2024 20:15    Assessment: *Esophageal food impaction *GERD *Esophageal dysphagia *Esophageal stricture  Plan: Will proceed with emergent EGD for food impaction removal.  The risks including infection, bleed, or perforation as well as benefits, limitations, alternatives and imponderables have been reviewed with the patient. Potential for esophageal dilation, biopsy, etc. have also been reviewed.  Questions have been answered. All parties agreeable.  Keep patient NPO.   Rolando Cliche. [name], D.O. Gastroenterology and Hepatology [facility]    LOS: 0 days     01/07/2024, 10:18 PM

## 2024-01-07 NOTE — Discharge Instructions (Signed)
 EGD Discharge instructions Please read the instructions outlined below and refer to this sheet in the next few weeks. These discharge instructions provide you with general information on caring for yourself after you leave the hospital. Your doctor may also give you specific instructions. While your treatment has been planned according to the most current medical practices available, unavoidable complications occasionally occur. If you have any problems or questions after discharge, please call your doctor. ACTIVITY You may resume your regular activity but move at a slower pace for the next 24 hours.  Take frequent rest periods for the next 24 hours.  Walking will help expel (get rid of) the air and reduce the bloated feeling in your abdomen.  No driving for 24 hours (because of the anesthesia (medicine) used during the test).  You may shower.  Do not sign any important legal documents or operate any machinery for 24 hours (because of the anesthesia used during the test).  NUTRITION Drink plenty of fluids.  You may resume your normal diet.  Begin with a light meal and progress to your normal diet.  Avoid alcoholic beverages for 24 hours or as instructed by your caregiver.  MEDICATIONS You may resume your normal medications unless your caregiver tells you otherwise.  WHAT YOU CAN EXPECT TODAY You may experience abdominal discomfort such as a feeling of fullness or "gas" pains.  FOLLOW-UP Your doctor will discuss the results of your test with you.  SEEK IMMEDIATE MEDICAL ATTENTION IF ANY OF THE FOLLOWING OCCUR: Excessive nausea (feeling sick to your stomach) and/or vomiting.  Severe abdominal pain and distention (swelling).  Trouble swallowing.  Temperature over 101 F (37.8 C).  Rectal bleeding or vomiting of blood.   Your upper endoscopy revealed food in the distal portion of your esophagus.  I remove this successfully today.  Also had inflammation in your stomach and small bowel.   Recommend pantoprazole  40 mg twice daily.  I have sent this to your pharmacy.  Follow-up in GI office in 2 days.  Will need to repeat upper endoscopy in 6 to 8 weeks with possible esophageal dilation.    In the interim: Recommend you avoid tough textures. All meats should be chopped finely. Eat slowly, take small bites, chew thoroughly, and drink plenty of liquids throughout meals.   I hope you have a great rest of your week!  Rolando Cliche. [name], D.O. Gastroenterology and Hepatology [facility]

## 2024-01-07 NOTE — Transfer of Care (Signed)
 Immediate Anesthesia Transfer of Care Note  Patient: Raymond Mccarthy  Procedure(s) Performed: EGD (ESOPHAGOGASTRODUODENOSCOPY)  Patient Location: PACU  Anesthesia Type:General  Level of Consciousness: awake  Airway & Oxygen Therapy: Patient Spontanous Breathing  Post-op Assessment: Post -op Vital signs reviewed and stable  Post vital signs: stable  Last Vitals:  Vitals Value Taken Time  BP 166/91 01/07/24 2256  Temp 36.6 C 01/07/24 2255  Pulse 89 01/07/24 2258  Resp 23 01/07/24 2258  SpO2 95 % 01/07/24 2258  Vitals shown include unfiled device data.  Last Pain:  Vitals:   01/07/24 1706  TempSrc:   PainSc: 7          Complications: There were no known notable events for this encounter.

## 2024-01-07 NOTE — Anesthesia Preprocedure Evaluation (Signed)
 Anesthesia Evaluation  Patient identified by MRN, date of birth, ID band Patient awake    Reviewed: Allergy & Precautions, H&P , NPO status , Patient's Chart, lab work & pertinent test results, reviewed documented beta blocker date and time   Airway Mallampati: II  TM Distance: >3 FB Neck ROM: full    Dental  (+) Lower Dentures, Upper Dentures   Pulmonary neg pulmonary ROS   Pulmonary exam normal breath sounds clear to auscultation       Cardiovascular Exercise Tolerance: Good negative cardio ROS Normal cardiovascular exam Rhythm:regular Rate:Normal     Neuro/Psych negative neurological ROS  negative psych ROS   GI/Hepatic negative GI ROS, Neg liver ROS,,,  Endo/Other  negative endocrine ROS    Renal/GU negative Renal ROS  negative genitourinary   Musculoskeletal   Abdominal   Peds  Hematology negative hematology ROS (+)   Anesthesia Other Findings   Reproductive/Obstetrics negative OB ROS                             Anesthesia Physical Anesthesia Plan  ASA: 1 and emergent  Anesthesia Plan: General   Post-op Pain Management: Minimal or no pain anticipated   Induction: Intravenous and Rapid sequence  PONV Risk Score and Plan: Ondansetron , Dexamethasone  and Midazolam   Airway Management Planned: Oral ETT  Additional Equipment: None  Intra-op Plan:   Post-operative Plan: Extubation in OR  Informed Consent: I have reviewed the patients History and Physical, chart, labs and discussed the procedure including the risks, benefits and alternatives for the proposed anesthesia with the patient or authorized representative who has indicated his/her understanding and acceptance.     Dental Advisory Given  Plan Discussed with: CRNA  Anesthesia Plan Comments:        Anesthesia Quick Evaluation

## 2024-01-07 NOTE — Op Note (Signed)
 [facility] Patient Name: Raymond Mccarthy Procedure Date: 01/07/2024 10:37 PM MRN: [id number] Date of Birth: December 04, 1965 Attending MD: Rolando Cliche. [name] , [place], [id number] CSN: [id number] Age: 58 Admit Type: Inpatient Procedure:                Upper GI endoscopy Indications:              Foreign body in the esophagus Providers:                Rolando Cliche. [name], DO, Pasco Bond, RN, Kimball Pence Tech, Technician Referring MD:              Medicines:                See the Anesthesia note for documentation of the                            administered medications Complications:            No immediate complications. Estimated Blood Loss:     Estimated blood loss was minimal. Procedure:                Pre-Anesthesia Assessment:                           - The anesthesia plan was to use general anesthesia.                           After obtaining informed consent, the endoscope was                            passed under direct vision. Throughout the                            procedure, the patient's blood pressure, pulse, and                            oxygen saturations were monitored continuously. The                            GIF-H190 ([id number]) scope was introduced through the                            mouth, and advanced to the second part of duodenum.                            The upper GI endoscopy was accomplished without                            difficulty. The patient tolerated the procedure                            well. Scope In: 10:39:26 PM Scope Out: 10:49:03 PM Total Procedure Duration: 0 hours 9 minutes 37 seconds  Findings:      Food was found in the  distal esophagus. Removal was accomplished with a       Rat Toothed forceps.      Mucosal changes including ringed esophagus and longitudinal furrows were       found in the middle third of the esophagus.      Diffuse moderate inflammation characterized by erythema was found in the        entire examined stomach.      Patchy moderate inflammation characterized by erythema and shallow       ulcerations was found in the duodenal bulb and in the second portion of       the duodenum. Impression:               - Food in the distal esophagus. Removal was                            successful.                           - Esophageal mucosal changes suggestive of                            eosinophilic esophagitis.                           - Gastritis.                           - Duodenitis. Moderate Sedation:      Per Anesthesia Care Recommendation:           - Patient has a contact number available for                            emergencies. The signs and symptoms of potential                            delayed complications were discussed with the                            patient. Return to normal activities tomorrow.                            Written discharge instructions were provided to the                            patient.                           - Mechanical soft diet.                           - Use Protonix  (pantoprazole ) 40 mg PO BID.                           - Repeat upper endoscopy in 6-8 weeks with possible                            dilation                           -  No ibuprofen, naproxen, or other non-steroidal                            anti-inflammatory drugs. Procedure Code(s):        --- Professional ---                           [phone], Esophagogastroduodenoscopy, flexible,                            transoral; with removal of foreign body(s) Diagnosis Code(s):        --- Professional ---                           B28.413K, Food in esophagus causing other injury,                            initial encounter                           K22.89, Other specified disease of esophagus                           K29.70, Gastritis, unspecified, without bleeding                           K29.80, Duodenitis without bleeding                            T18.108A, Unspecified foreign body in esophagus                            causing other injury, initial encounter CPT copyright 2022 American Medical Association. All rights reserved. The codes documented in this report are preliminary and upon coder review may  be revised to meet current compliance requirements. Rolando Cliche. [name], DO Rolando Cliche. [name], DO 01/07/2024 10:55:46 PM This report has been signed electronically. Number of Addenda: 0

## 2024-01-08 ENCOUNTER — Encounter (HOSPITAL_COMMUNITY): Payer: Self-pay | Admitting: Internal Medicine

## 2024-01-08 NOTE — Progress Notes (Unsigned)
 GI Office Note    Referring Provider: Orlena Bitters, MD Primary Care Physician:  Orlena Bitters, MD  Primary Gastroenterologist:  Rodrick Clapper - ask patient. ***  Chief Complaint   No chief complaint on file.  History of Present Illness   Raymond Mccarthy is a 58 y.o. male presenting today at the request of Vyas, Dhruv B, MD for GERD, EoE, dysphagia and recent food impaction.   EGD November 2021: 1. Large meat bolus in the distal esophagus removed endoscopically.  2. Esophageal stricture with underlying macerated mucosa  3. Duodenitis - Start omeprazole 20 mg once daily.   EGD March 2022: - Mucosal changes including longitudinal furrows, white plaques and stenosis were found in the middle third of the esophagus and in the lower third of the esophagus. Esophageal findings were graded using the Eosinophilic Esophagitis Endoscopic Reference Score ( EoE- EREFS) as: Edema Grade 1 Present ( decreased clarity or absence of vascular markings) , Rings Grade 1 Mild ( subtle circumferential ridges seen on esophageal distension) , Exudates Grade 1 Mild ( scattered white lesions involving less than 10 percent of the esophageal surface area) , Furrows Grade 1 Present ( vertical lines with or without visible depth) and Stricture present at GEJ. Biopsies were obtained from the proximal and distal esophagus with cold forceps for histology of suspected eosinophilic esophagitis. A TTS dilator was passed through the scope. Dilation with a 13. 5- 14. 5- 15. 5 mm balloon dilator was performed to 14. 5 mm ( after 13. 5 mm) . The dilation site was examined and showed moderate mucosal disruption and moderate improvement in luminal narrowing.  - The cardia and gastric fundus were normal on retroflexion.  - The entire examined stomach was normal. Biopsies were taken with a cold forceps for histology and Helicobacter pylori testing.  - One non- bleeding cratered duodenal ulcer with no stigmata of bleeding was  found in the duodenal bulb near the duodenal sweep. Surrounding bulbar duodenitis. The lesion was 9 mm in largest dimension.  - The second portion of the duodenum was normal.  EGD May 2022: - Mildly esophagitis with no bleeding was found in the middle and lower third of the esophagus. This has the appearance of EoE ( linear furrowing, circumferential rings and white speaks), but biopsies 2 months ago most consistent with reflux esophagitis.  - One benign- appearing, intrinsic moderate ( circumferential scarring or stenosis; an endoscope may pass) stenosis was found 40 cm from the incisors ( at [facility]) . This stenosis measured 1. 3 cm ( inner diameter) x less than one cm ( in length) . The stenosis was traversed. A TTS dilator was passed through the scope. Dilation with a 16- 17- 18 mm balloon dilator was performed to 17 mm. The dilation site was examined and showed moderate mucosal disruption.  - The entire examined stomach was normal.  - Mild inflammation characterized by erythema was found in the duodenal bulb.  - The previous seen duodenal ulcer has healed with PPI.  - The second portion of the duodenum was normal.  EGD 06/27/22: -Esophageal food impaction?disimpacted  -Soft peptic stricture with associated distal esophageal erosions consistent with erosive reflux esophagitis.  Underwent emergent EGD Monday 4/28 for food impaction: - Food in the distal esophagus. Removal was successful. (Steak) - Esophageal mucosal changes suggestive of eosinophilic esophagitis.  - Gastritis.  - Duodenitis. - Use pantoprazole  40 mg BID - Avoid NSAIDs - Repeat EGD in 6-8 weeks.   Today:  Rourk or Chartered loss adjuster - ask patient. ***  Wt Readings from Last 3 Encounters:  01/07/24 205 lb 14.6 oz (93.4 kg)  08/15/22 206 lb (93.4 kg)  07/28/22 206 lb 8 oz (93.7 kg)    Current Outpatient Medications  Medication Sig Dispense Refill   allopurinol (ZYLOPRIM) 300 MG tablet Take 300 mg by mouth daily as needed  (gout).     metoprolol  tartrate (LOPRESSOR ) 50 MG tablet Take 1 tablet (50 mg total) by mouth once for 1 dose. PLEASE TAKE METOPROLOL  2  HOURS PRIOR TO CTA SCAN. 1 tablet 0   pantoprazole  (PROTONIX ) 40 MG tablet Take 1 tablet (40 mg total) by mouth 2 (two) times daily. 60 tablet 11   No current facility-administered medications for this visit.    Past Medical History:  Diagnosis Date   Eosinophilic esophagitis     Past Surgical History:  Procedure Laterality Date   BIOPSY  01/01/2019   Procedure: BIOPSY;  Surgeon: Nannette Babe, MD;  Location: [facility] ENDOSCOPY;  Service: Gastroenterology;;   ESOPHAGOGASTRODUODENOSCOPY N/A 01/01/2019   Procedure: ESOPHAGOGASTRODUODENOSCOPY (EGD);  Surgeon: Nannette Babe, MD;  Location: [facility] ENDOSCOPY;  Service: Gastroenterology;  Laterality: N/A;   ESOPHAGOGASTRODUODENOSCOPY N/A 01/07/2024   Procedure: EGD (ESOPHAGOGASTRODUODENOSCOPY);  Surgeon: Vinetta Greening, DO;  Location: [facility] ENDO SUITE;  Service: Endoscopy;  Laterality: N/A;   ESOPHAGOGASTRODUODENOSCOPY (EGD) WITH PROPOFOL  N/A 07/30/2020   Procedure: ESOPHAGOGASTRODUODENOSCOPY (EGD) WITH PROPOFOL ;  Surgeon: Tobin Forts, MD;  Location: [facility] ENDOSCOPY;  Service: Endoscopy;  Laterality: N/A;   ESOPHAGOGASTRODUODENOSCOPY (EGD) WITH PROPOFOL  N/A 06/27/2022   Procedure: ESOPHAGOGASTRODUODENOSCOPY (EGD) WITH PROPOFOL ;  Surgeon: Suzette Espy, MD;  Location: [facility] ENDO SUITE;  Service: Endoscopy;  Laterality: N/A;   FOREIGN BODY REMOVAL  01/01/2019   Procedure: FOREIGN BODY REMOVAL;  Surgeon: Nannette Babe, MD;  Location: [facility] ENDOSCOPY;  Service: Gastroenterology;;   FOREIGN BODY REMOVAL  07/30/2020   Procedure: FOREIGN BODY REMOVAL;  Surgeon: Tobin Forts, MD;  Location: [facility] ENDOSCOPY;  Service: Endoscopy;;   UPPER GASTROINTESTINAL ENDOSCOPY      Family History  Problem Relation Age of Onset   COPD Mother    Dementia Father    Colon cancer Neg Hx    Esophageal cancer Neg Hx    Stomach cancer Neg Hx     Pancreatic cancer Neg Hx    Liver disease Neg Hx    Colon polyps Neg Hx    Rectal cancer Neg Hx     Allergies as of 01/09/2024 - Review Complete 01/07/2024  Allergen Reaction Noted   Bee venom Hives 04/19/2021   Penicillins Hives 04/19/2021    Social History   Socioeconomic History   Marital status: Married    Spouse name: Not on file   Number of children: Not on file   Years of education: Not on file   Highest education level: Not on file  Occupational History   Not on file  Tobacco Use   Smoking status: Never    Passive exposure: Never   Smokeless tobacco: Never  Vaping Use   Vaping status: Never Used  Substance and Sexual Activity   Alcohol use: Not Currently   Drug use: Not Currently   Sexual activity: Not on file  Other Topics Concern   Not on file  Social History Narrative   Not on file   Social Drivers of Health   Financial Resource Strain: Not on file  Food Insecurity: Not on file  Transportation Needs: Not on file  Physical  Activity: Not on file  Stress: Not on file  Social Connections: Not on file  Intimate Partner Violence: Not on file     Review of Systems   Gen: Denies any fever, chills, fatigue, weight loss, lack of appetite.  CV: Denies chest pain, heart palpitations, peripheral edema, syncope.  Resp: Denies shortness of breath at rest or with exertion. Denies wheezing or cough.  GI: see HPI GU : Denies urinary burning, urinary frequency, urinary hesitancy MS: Denies joint pain, muscle weakness, cramps, or limitation of movement.  Derm: Denies rash, itching, dry skin Psych: Denies depression, anxiety, memory loss, and confusion Heme: Denies bruising, bleeding, and enlarged lymph nodes.  Physical Exam   There were no vitals taken for this visit.  General:   Alert and oriented. Pleasant and cooperative. Well-nourished and well-developed.  Head:  Normocephalic and atraumatic. Eyes:  Without icterus, sclera clear and conjunctiva pink.   Ears:  Normal auditory acuity. Mouth:  No deformity or lesions, oral mucosa pink.  Lungs:  Clear to auscultation bilaterally. No wheezes, rales, or rhonchi. No distress.  Heart:  S1, S2 present without murmurs appreciated.  Abdomen:  +BS, soft, non-tender and non-distended. No HSM noted. No guarding or rebound. No masses appreciated.  Rectal:  Deferred  Msk:  Symmetrical without gross deformities. Normal posture. Extremities:  Without edema. Neurologic:  Alert and  oriented x4;  grossly normal neurologically. Skin:  Intact without significant lesions or rashes. Psych:  Alert and cooperative. Normal mood and affect.  Assessment   Juanita Zeisloft is a 58 y.o. male with a history of *** presenting today with   GERD, EoE, Dysphagia: - history of food impactions requiring endoscopic removal and prior esophageal dilations with most recent being Monday 4/28 after eating steak & was successfully removed. -    PLAN   Proceed with upper endoscopy with propofol  by Dr. Aaron Aas in near future: the risks, benefits, and alternatives have been discussed with the patient in detail. The patient states understanding and desires to proceed. ASA 1/2 Continue PPI BID Avoid NSAIDs May need to consider oral budesonide ***   Julian Obey, MSN, FNP-BC, AGACNP-BC [facility]

## 2024-01-08 NOTE — Anesthesia Postprocedure Evaluation (Signed)
 Anesthesia Post Note  Patient: Raymond Mccarthy  Procedure(s) Performed: EGD (ESOPHAGOGASTRODUODENOSCOPY)  Patient location during evaluation: PACU Anesthesia Type: General Level of consciousness: awake and alert Pain management: pain level controlled Vital Signs Assessment: post-procedure vital signs reviewed and stable Respiratory status: spontaneous breathing, nonlabored ventilation, respiratory function stable and patient connected to nasal cannula oxygen Cardiovascular status: blood pressure returned to baseline and stable Postop Assessment: no apparent nausea or vomiting Anesthetic complications: no   There were no known notable events for this encounter.   Last Vitals:  Vitals:   01/07/24 2315 01/07/24 2319  BP: (!) 153/92 (!) 147/86  Pulse: 82   Resp: 20 20  Temp:  36.7 C  SpO2: 96% 96%    Last Pain:  Vitals:   01/07/24 2319  TempSrc: Oral  PainSc: 0-No pain                 Julyssa Kyer L Biance Moncrief

## 2024-01-09 ENCOUNTER — Ambulatory Visit: Admitting: Gastroenterology

## 2024-01-09 ENCOUNTER — Encounter: Payer: Self-pay | Admitting: Gastroenterology

## 2024-01-09 VITALS — BP 145/82 | HR 73 | Temp 98.7°F | Ht 67.0 in | Wt 207.0 lb

## 2024-01-09 DIAGNOSIS — K222 Esophageal obstruction: Secondary | ICD-10-CM | POA: Diagnosis not present

## 2024-01-09 DIAGNOSIS — K219 Gastro-esophageal reflux disease without esophagitis: Secondary | ICD-10-CM | POA: Diagnosis not present

## 2024-01-09 DIAGNOSIS — K200 Eosinophilic esophagitis: Secondary | ICD-10-CM

## 2024-01-09 DIAGNOSIS — K2100 Gastro-esophageal reflux disease with esophagitis, without bleeding: Secondary | ICD-10-CM

## 2024-01-09 NOTE — Patient Instructions (Addendum)
 We are scheduling you for upper endoscopy in the near future with Dr. [name].  Continue taking your pantoprazole  but is important that you take this twice daily.   Avoid any NSAIDs - ibuprofen, Aleve, Advil, diclofenac, BC or Goody powders.  Please only take Tylenol as needed for pain.  We will have you follow-up after your upper endoscopy.  It was a pleasure to see you today. I want to create trusting relationships with patients. If you receive a survey regarding your visit,  I greatly appreciate you taking time to fill this out on paper or through your MyChart. I value your feedback.  Julian Obey, MSN, FNP-BC, AGACNP-BC [facility]

## 2024-01-17 ENCOUNTER — Encounter: Payer: Self-pay | Admitting: *Deleted

## 2024-01-17 ENCOUNTER — Telehealth: Payer: Self-pay | Admitting: *Deleted

## 2024-01-17 NOTE — Telephone Encounter (Signed)
 [employer] PA: CPT Code GEEGD Description: Esophagogastroduodenoscopy Case Number: [id number] Review Date: 01/17/2024 4:21:25 PM Expiration Date: N/A Status: This member is not in scope for prior-authorization/notification for the services requested. You can save the case reference ID as validation of your request.

## 2024-01-30 ENCOUNTER — Other Ambulatory Visit: Payer: Self-pay

## 2024-01-30 ENCOUNTER — Emergency Department (HOSPITAL_COMMUNITY)

## 2024-01-30 ENCOUNTER — Emergency Department (HOSPITAL_COMMUNITY)
Admission: EM | Admit: 2024-01-30 | Discharge: 2024-01-30 | Disposition: A | Discharge status: Home / Self Care | Attending: Emergency Medicine | Admitting: Emergency Medicine

## 2024-01-30 DIAGNOSIS — R079 Chest pain, unspecified: Secondary | ICD-10-CM

## 2024-01-30 DIAGNOSIS — S60511A Abrasion of right hand, initial encounter: Secondary | ICD-10-CM | POA: Insufficient documentation

## 2024-01-30 DIAGNOSIS — R748 Abnormal levels of other serum enzymes: Secondary | ICD-10-CM | POA: Diagnosis not present

## 2024-01-30 DIAGNOSIS — E876 Hypokalemia: Secondary | ICD-10-CM | POA: Diagnosis not present

## 2024-01-30 DIAGNOSIS — S20219A Contusion of unspecified front wall of thorax, initial encounter: Secondary | ICD-10-CM | POA: Insufficient documentation

## 2024-01-30 DIAGNOSIS — S301XXA Contusion of abdominal wall, initial encounter: Secondary | ICD-10-CM | POA: Diagnosis not present

## 2024-01-30 DIAGNOSIS — R519 Headache, unspecified: Secondary | ICD-10-CM | POA: Diagnosis not present

## 2024-01-30 DIAGNOSIS — Y92410 Unspecified street and highway as the place of occurrence of the external cause: Secondary | ICD-10-CM | POA: Diagnosis not present

## 2024-01-30 DIAGNOSIS — S299XXA Unspecified injury of thorax, initial encounter: Secondary | ICD-10-CM | POA: Diagnosis present

## 2024-01-30 LAB — CBC
HCT: 44.5 % (ref 39.0–52.0)
Hemoglobin: 15.3 g/dL (ref 13.0–17.0)
MCH: 30.2 pg (ref 26.0–34.0)
MCHC: 34.4 g/dL (ref 30.0–36.0)
MCV: 87.9 fL (ref 80.0–100.0)
Platelets: 292 10*3/uL (ref 150–400)
RBC: 5.06 MIL/uL (ref 4.22–5.81)
RDW: 13 % (ref 11.5–15.5)
WBC: 5.2 10*3/uL (ref 4.0–10.5)
nRBC: 0 % (ref 0.0–0.2)

## 2024-01-30 LAB — I-STAT CHEM 8, ED
BUN: 10 mg/dL (ref 6–20)
Calcium, Ion: 1.16 mmol/L (ref 1.15–1.40)
Chloride: 101 mmol/L (ref 98–111)
Creatinine, Ser: 1.1 mg/dL (ref 0.61–1.24)
Glucose, Bld: 137 mg/dL — ABNORMAL HIGH (ref 70–99)
HCT: 45 % (ref 39.0–52.0)
Hemoglobin: 15.3 g/dL (ref 13.0–17.0)
Potassium: 3.5 mmol/L (ref 3.5–5.1)
Sodium: 142 mmol/L (ref 135–145)
TCO2: 27 mmol/L (ref 22–32)

## 2024-01-30 LAB — COMPREHENSIVE METABOLIC PANEL WITH GFR
ALT: 92 U/L — ABNORMAL HIGH (ref 0–44)
AST: 72 U/L — ABNORMAL HIGH (ref 15–41)
Albumin: 3.8 g/dL (ref 3.5–5.0)
Alkaline Phosphatase: 52 U/L (ref 38–126)
Anion gap: 8 (ref 5–15)
BUN: 11 mg/dL (ref 6–20)
CO2: 26 mmol/L (ref 22–32)
Calcium: 8.9 mg/dL (ref 8.9–10.3)
Chloride: 102 mmol/L (ref 98–111)
Creatinine, Ser: 1 mg/dL (ref 0.61–1.24)
GFR, Estimated: >60 mL/min (ref >60)
Glucose, Bld: 136 mg/dL — ABNORMAL HIGH (ref 70–99)
Potassium: 3.3 mmol/L — ABNORMAL LOW (ref 3.5–5.1)
Sodium: 136 mmol/L (ref 135–145)
Total Bilirubin: 1.1 mg/dL (ref 0.0–1.2)
Total Protein: 6.9 g/dL (ref 6.5–8.1)

## 2024-01-30 MED ORDER — OXYCODONE HCL 5 MG PO TABS: 5.0000 mg | ORAL_TABLET | ORAL | 0 refills | Status: AC | PRN

## 2024-01-30 MED ORDER — ACETAMINOPHEN 325 MG PO TABS: 650.0000 mg | ORAL_TABLET | Freq: Four times a day (QID) | ORAL | 0 refills | Status: AC | PRN

## 2024-01-30 MED ORDER — POTASSIUM CHLORIDE CRYS ER 20 MEQ PO TBCR
40.0000 meq | EXTENDED_RELEASE_TABLET | Freq: Once | ORAL | Status: AC
  Administered 2024-01-30: 40 meq via ORAL
  Filled 2024-01-30: qty 2

## 2024-01-30 MED ORDER — IBUPROFEN 600 MG PO TABS: 600.0000 mg | ORAL_TABLET | Freq: Four times a day (QID) | ORAL | 0 refills | Status: AC | PRN

## 2024-01-30 MED ORDER — CYCLOBENZAPRINE HCL 10 MG PO TABS: 10.0000 mg | ORAL_TABLET | Freq: Two times a day (BID) | ORAL | 0 refills | Status: AC | PRN

## 2024-01-30 MED ORDER — MORPHINE SULFATE (PF) 4 MG/ML IV SOLN
4.0000 mg | Freq: Once | INTRAVENOUS | Status: AC
  Administered 2024-01-30: 4 mg via INTRAVENOUS
  Filled 2024-01-30: qty 1

## 2024-01-30 MED ORDER — IOHEXOL 300 MG/ML  SOLN
100.0000 mL | Freq: Once | INTRAMUSCULAR | Status: AC | PRN
  Administered 2024-01-30: 100 mL via INTRAVENOUS

## 2024-01-30 MED ORDER — LIDOCAINE 5 % EX PTCH: 1.0000 | MEDICATED_PATCH | Freq: Every day | CUTANEOUS | 0 refills | Status: AC | PRN

## 2024-01-30 NOTE — ED Notes (Signed)
 Pt in CT at this time.
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
## 2024-01-30 NOTE — ED Notes (Signed)
 Patient back from CT.
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
## 2024-02-21 ENCOUNTER — Encounter (HOSPITAL_COMMUNITY)
Admission: RE | Admit: 2024-02-21 | Discharge: 2024-02-21 | Disposition: A | Discharge status: Home / Self Care | Source: Ambulatory Visit | Attending: Internal Medicine | Admitting: Internal Medicine

## 2024-02-21 NOTE — Pre-Procedure Instructions (Signed)
Attempted pre-op phone call. Left Vm for him to call us back. 

## 2024-02-22 ENCOUNTER — Encounter: Payer: Self-pay | Admitting: *Deleted

## 2024-02-22 ENCOUNTER — Telehealth: Payer: Self-pay | Admitting: *Deleted
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
## 2024-02-22 NOTE — Telephone Encounter (Signed)
 LMOVM to return call  cannot reach Received: Today Venancio Gibney, RN  [place], Kandee Orion, CMA; Alvester Johnson, LPN Guess who! I have made several attempts and left messages for Raymond Mccarthy to call me. I have also left VM for his wife.

## 2024-02-22 NOTE — Pre-Procedure Instructions (Signed)
 Attempted pre-op phone call. Left VM for him to call us  back. I also left a VM for his wife- Moira Andrews to call us  back on number [phone]. Office notified.

## 2024-03-21 NOTE — Telephone Encounter (Signed)
 LMOVM to return call  Pt left vm stating he needed to reschedule his procedure.

## 2024-03-25 ENCOUNTER — Encounter (HOSPITAL_COMMUNITY): Admission: RE | Payer: Self-pay | Source: Home / Self Care

## 2024-03-25 ENCOUNTER — Ambulatory Visit (HOSPITAL_COMMUNITY): Admission: RE | Admit: 2024-03-25 | Source: Home / Self Care | Admitting: Internal Medicine

## 2024-03-25 SURGERY — EGD (ESOPHAGOGASTRODUODENOSCOPY)
Anesthesia: Choice

## 2024-03-25 NOTE — Telephone Encounter (Signed)
 LMOVM to return call  Pt returned call  EGD/ED w/Dr.Carver, asa 2

## 2024-03-25 NOTE — Telephone Encounter (Signed)
 LMOVM to return call to reshedule   Harlene LELON Lips, RN: Raymond Mccarthy. I just talked with [name] who was scheduled for today and he said he had talked with someone in the office about rescheduling. I am thinking he just wasn't taken off the schedule and I talked with him and he wanted to know how to reschedule. Thank you

## 2024-03-25 NOTE — Progress Notes (Signed)
 Called patient day of procedure to see if he was coming. Patient stated he had talked with the office about rescheduling but had not been rescheduled yet. Office was notified.

## 2024-04-28 ENCOUNTER — Encounter: Payer: Self-pay | Admitting: *Deleted

## 2024-05-20 ENCOUNTER — Ambulatory Visit (HOSPITAL_COMMUNITY): Admission: RE | Admit: 2024-05-20 | Source: Home / Self Care | Admitting: Internal Medicine

## 2024-05-20 ENCOUNTER — Encounter (HOSPITAL_COMMUNITY): Admission: RE | Payer: Self-pay | Source: Home / Self Care

## 2024-05-20 SURGERY — EGD (ESOPHAGOGASTRODUODENOSCOPY)
Anesthesia: Choice

## 2024-11-04 ENCOUNTER — Ambulatory Visit: Admitting: Gastroenterology
